# Patient Record
Sex: Female | Born: 1955 | Race: White | Hispanic: No | Marital: Married
Health system: Midwestern US, Academic
[De-identification: ages and names within clinical notes are randomized; demographics above are authoritative.]

---

## 2016-06-12 IMAGING — CR CHEST
2 series · 2 of 2 positions shown · non-contrast
Comparison: none

[chest pa]
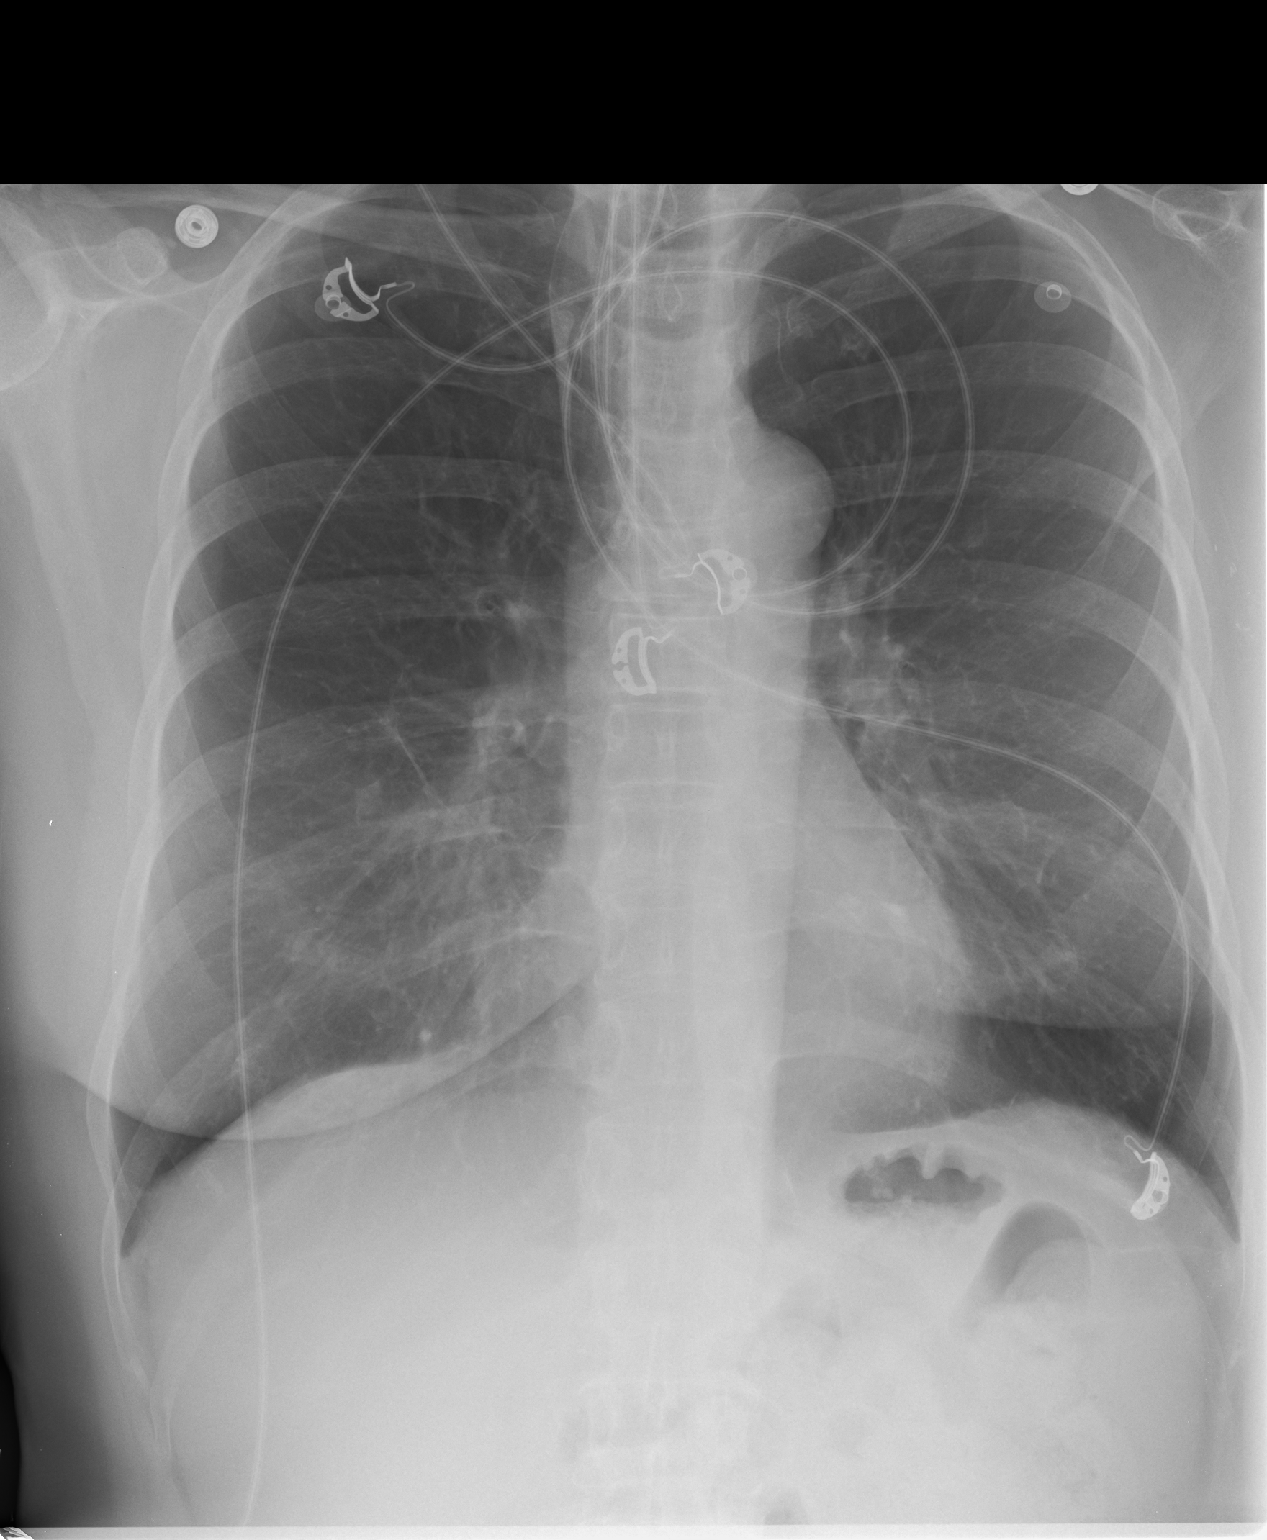

[chest lat]
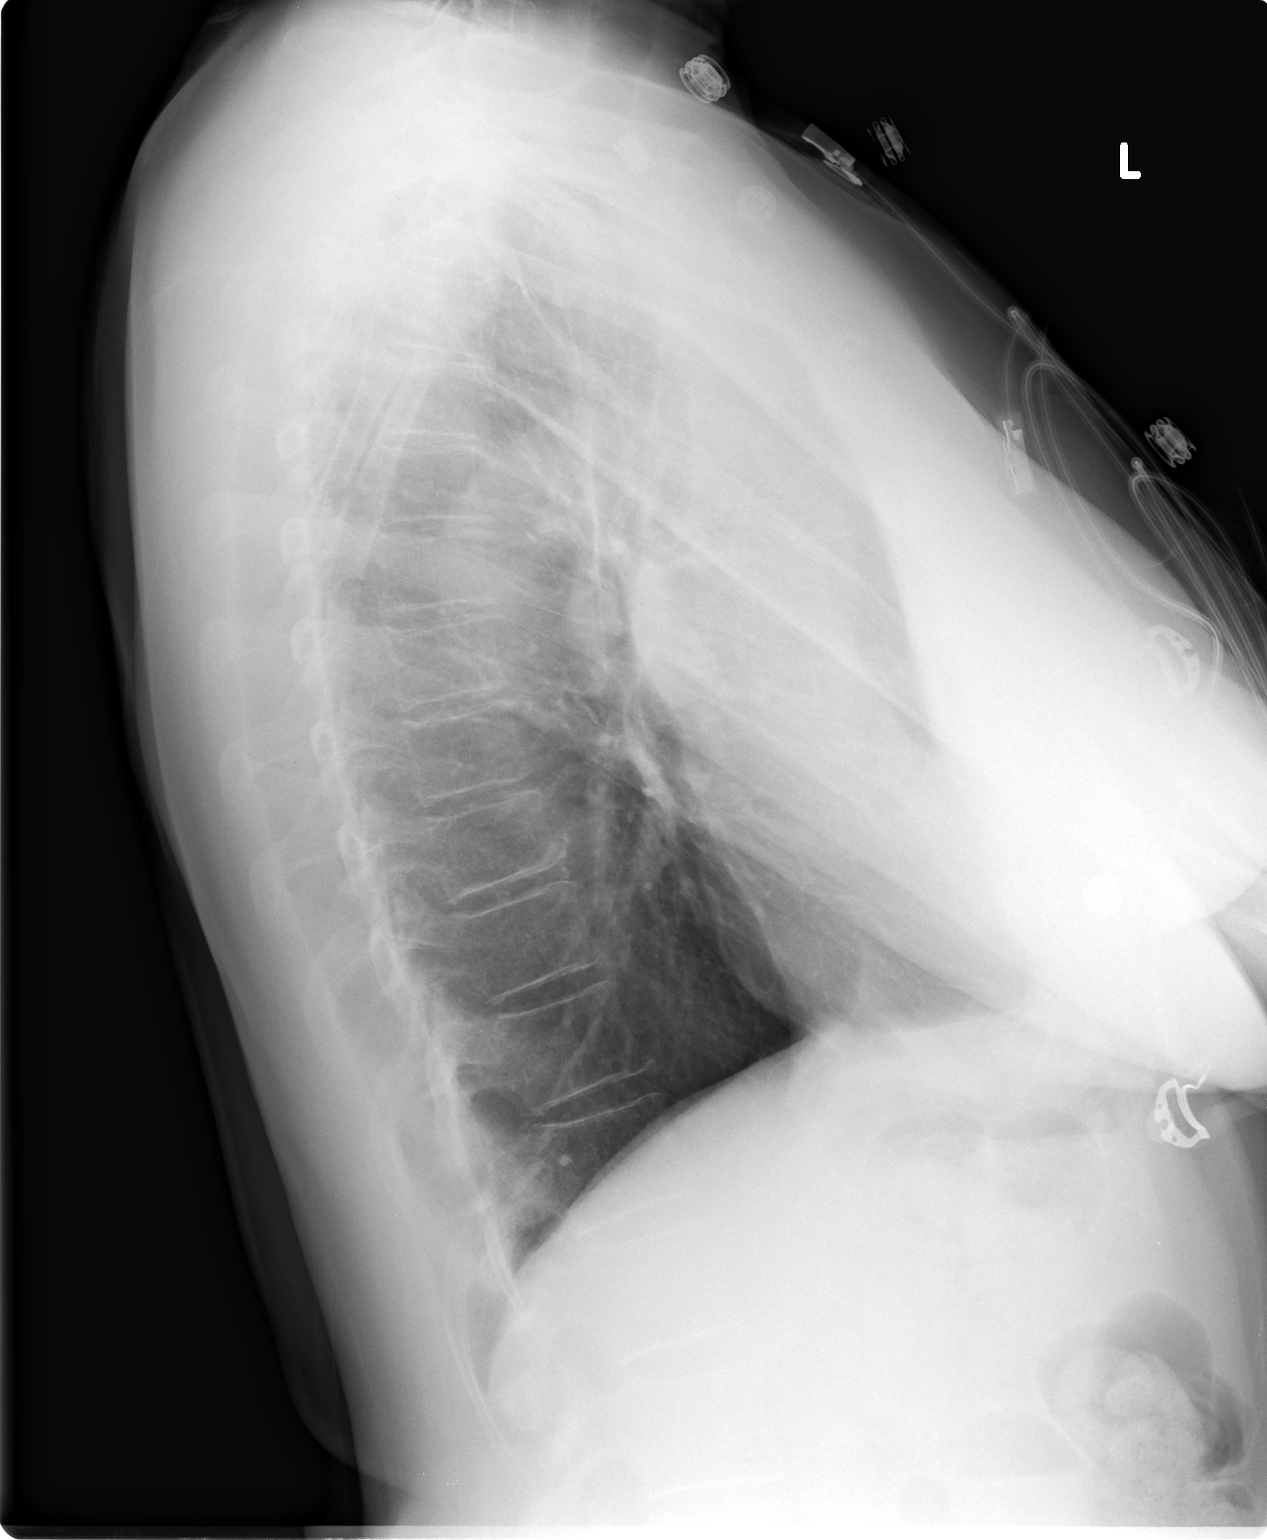

[2 of 2 positions shown; findings below may reference images not displayed]

EXAM

RADIOLOGICAL EXAMINATION, CHEST; 2 VIEWS FRONTAL AND LATERAL CPT 65808

INDICATION

CHEST PAIN/TIGHTNESS
CHEST PAIN/TIGHTNESS. SIEZURE LIKE ACTIVITY THIS AM.

TECHNIQUE

2 views of the chest were acquired.

COMPARISONS

None

FINDINGS

The cardiac silhouette is within normal limits. The lungs are clear. The pulmonary vasculature is
normal in caliber.

IMPRESSION

No acute cardiopulmonary process. Previous granulomatous disease is noted with a calcified
granuloma in the right lung base.

## 2016-06-12 IMAGING — CT Head^_WITHOUT_CONTRAST (Adult)
2 series · 15 of 30 positions shown, 19 images · non-contrast
Comparison: none

[Series 2: brain w/o 4.8 brain · axial · non-contrast · 0.55mm/px · z∈[+112,+234]mm · 13 of 28 slices shown, 17 images]
[im 2/28  brain]
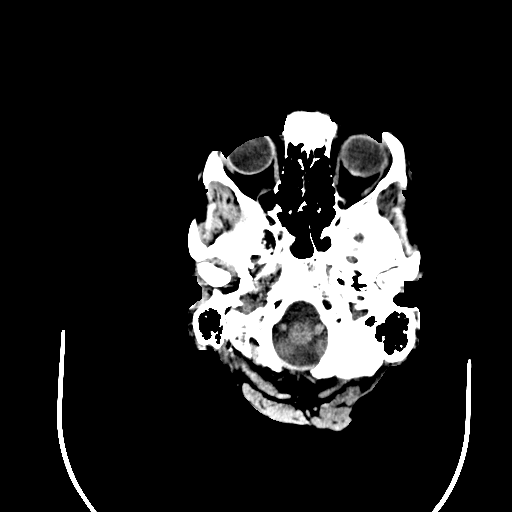
[im 2/28  bone]
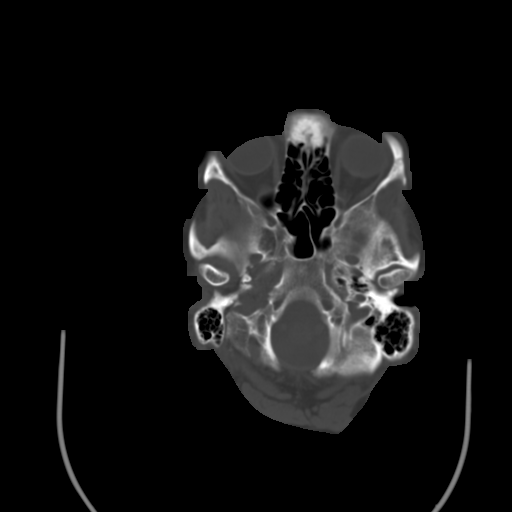
[im 4/28  brain]
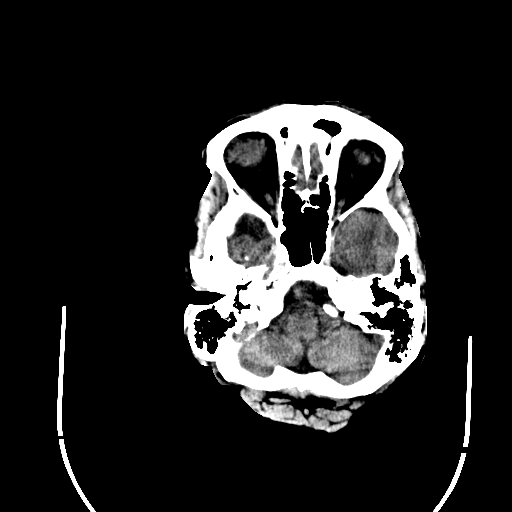
[im 6/28  brain]
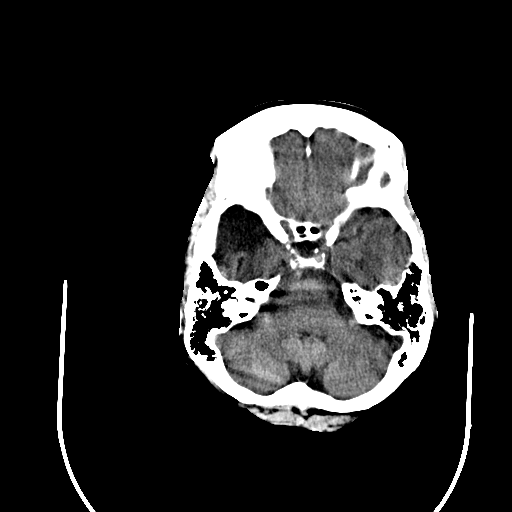
[im 8/28  brain]
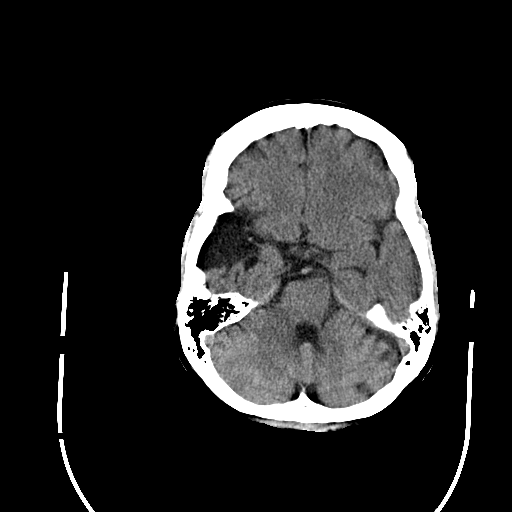
[im 10/28  brain]
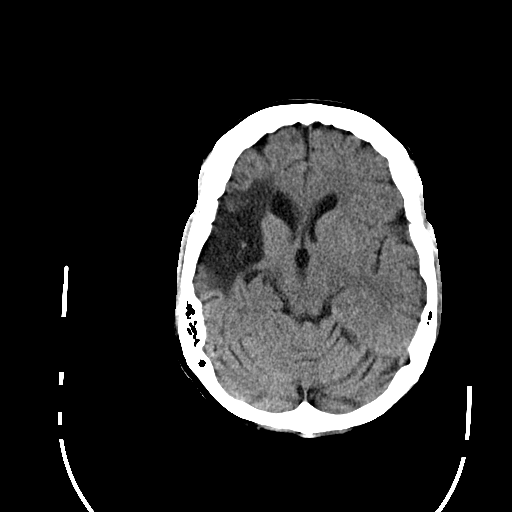
[im 10/28  bone]
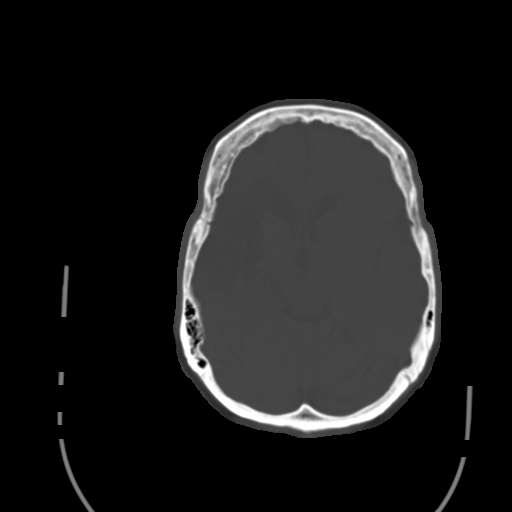
[im 12/28  brain]
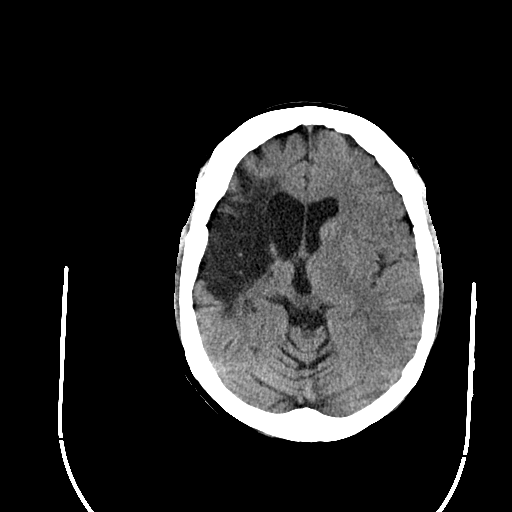
[im 14/28  brain]
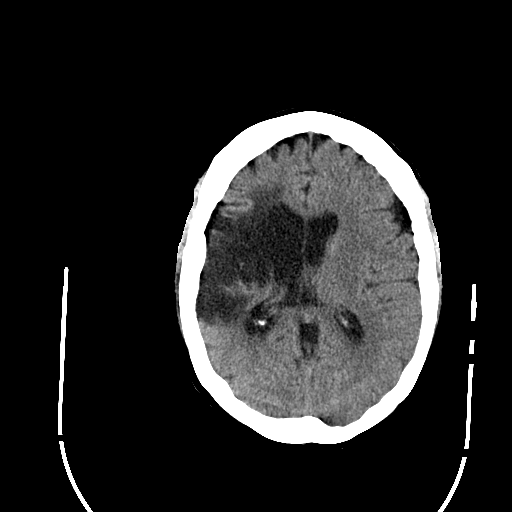
[im 16/28  brain]
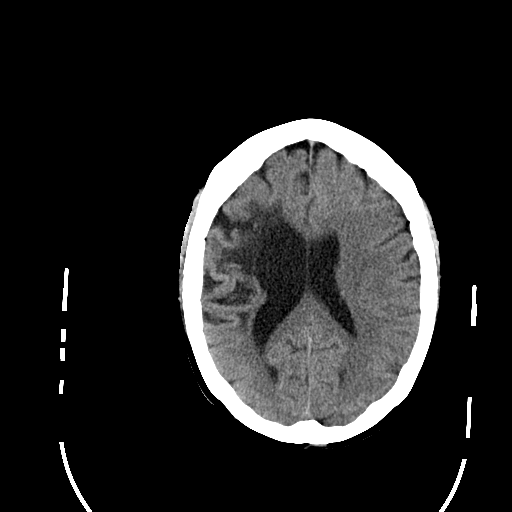
[im 18/28  brain]
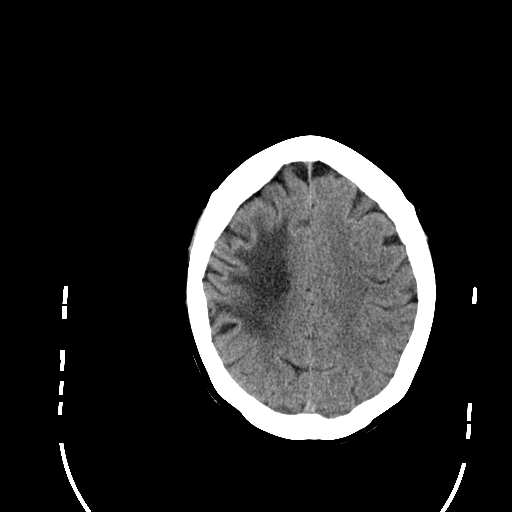
[im 18/28  bone]
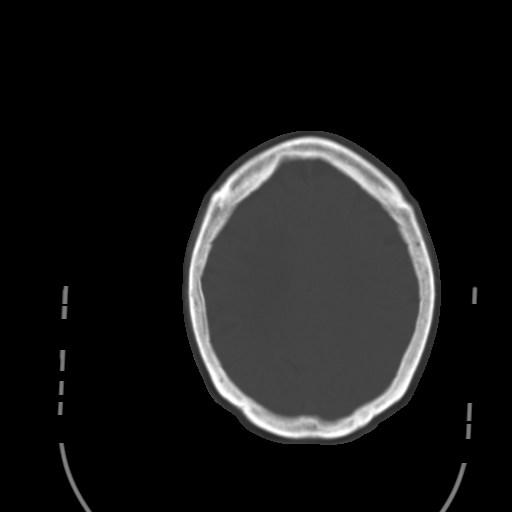
[im 20/28  brain]
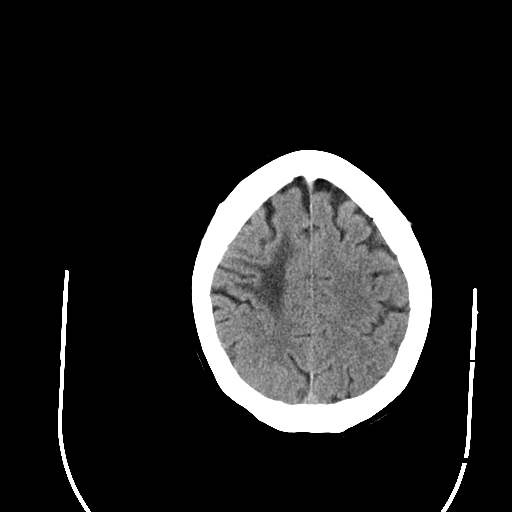
[im 22/28  brain]
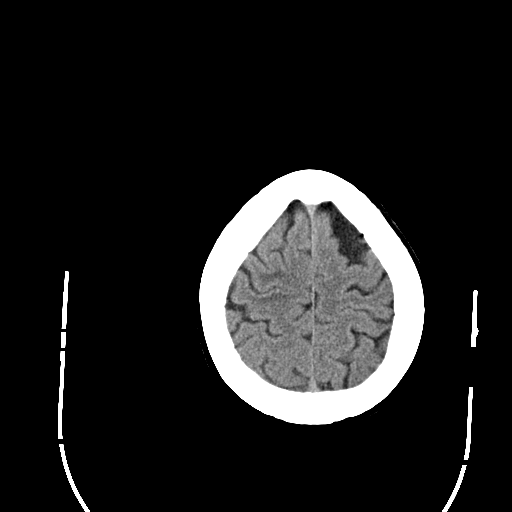
[im 24/28  brain]
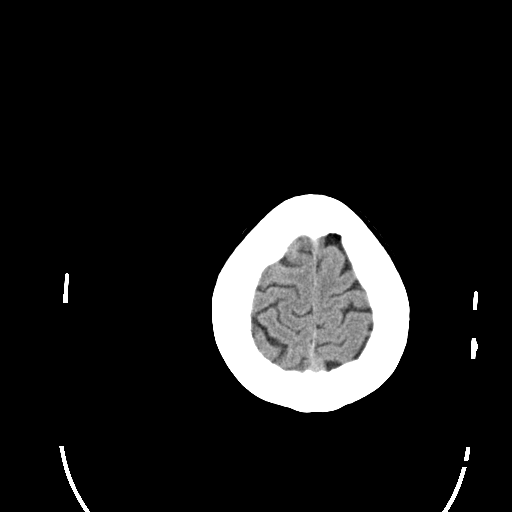
[im 26/28  brain]
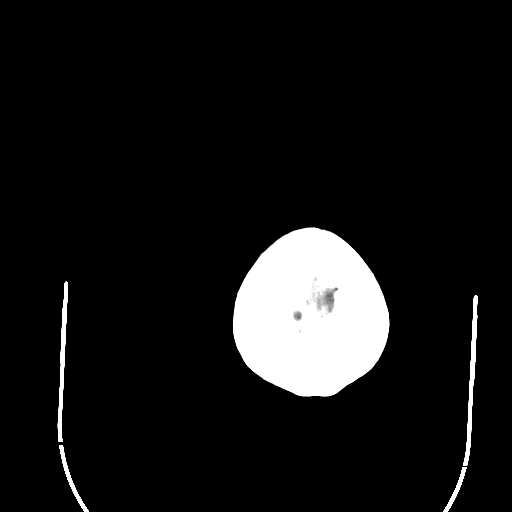
[im 26/28  bone]
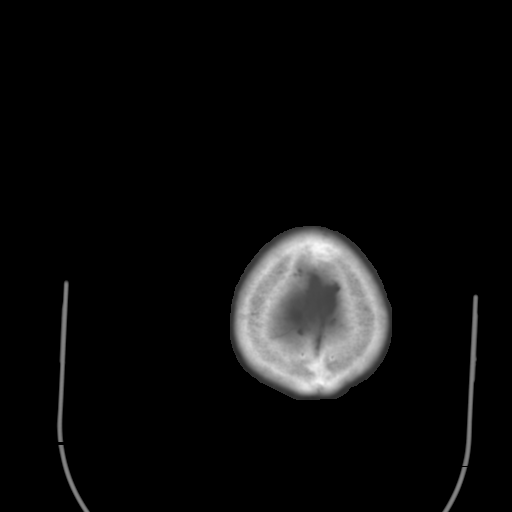

[Series 3: brain w/o 4.8 bone · axial · non-contrast · 0.55mm/px · z∈[+112,+131]mm · 2 of 29 slices shown]
[im 3/29  bone]
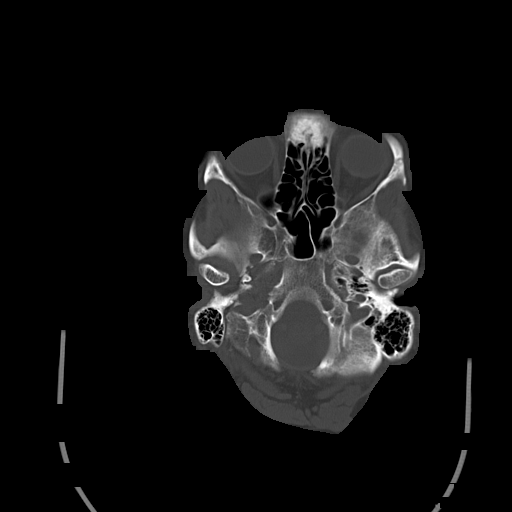
[im 7/29  bone]
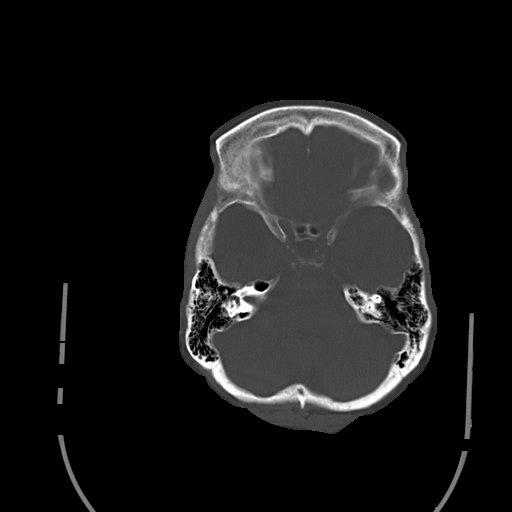

[15 of 30 positions shown; findings below may reference images not displayed]

EXAM

Head CT without contrast.

INDICATION

Possible seizure activity today
SEIZURE LIKE ACTIVITY TODAY. H/O RIGHT SIDED STROKE 24 YEARS AGO. NO PRIOR
EXAXMS. BG/ME

FINDINGS

CT of the head was performed without contrast.

All CT scans at this facility use dose modulation, iterative reconstruction, and/or weight based
dosing when appropriate to reduce radiation dose to as low as reasonably achievable.

There is evidence of a large remote infarct involving the cortex and white matter of the right
frontal lobe antral lateral aspect of the right temporal lobe. This also extends into the anterior
portion of the right parietal lobe.

There is compensatory hypertrophy of there right lateral ventricle.

There is no acute hemorrhage. There is no mass effect or midline shift.

IMPRESSION

There is a large chronic infarct of the cortex and white matter of the right frontal lobe and
anterior portions of the right temporal and parietal lobes. No acute appearing brain lesion is
identified. There is no hemorrhage there. There is no mass effect or midline shift.

## 2017-02-11 IMAGING — MG MAMMOGRAM, DIGITAL SCREEN BILA
1 series · 3 of 3 positions shown · non-contrast
Comparison: none

[Series 2: R CC · right · 3 of 3 slices shown]
[im 1/3]
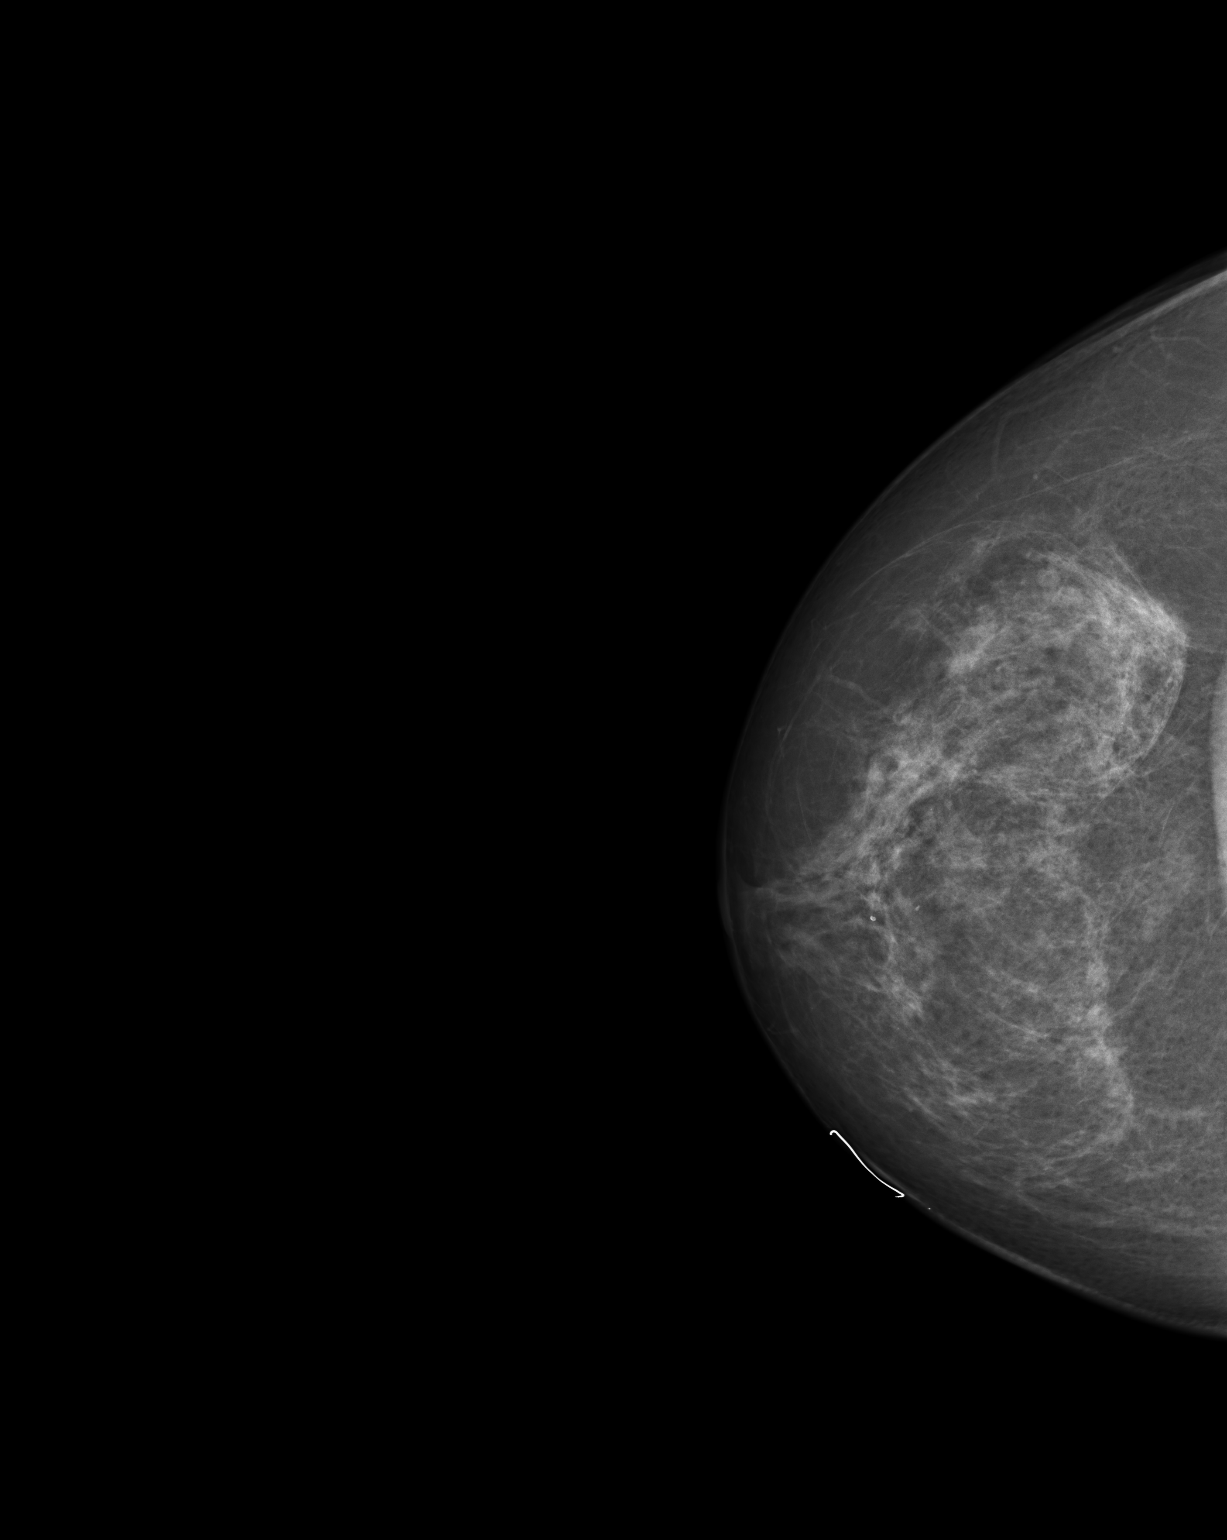
[im 2/3]
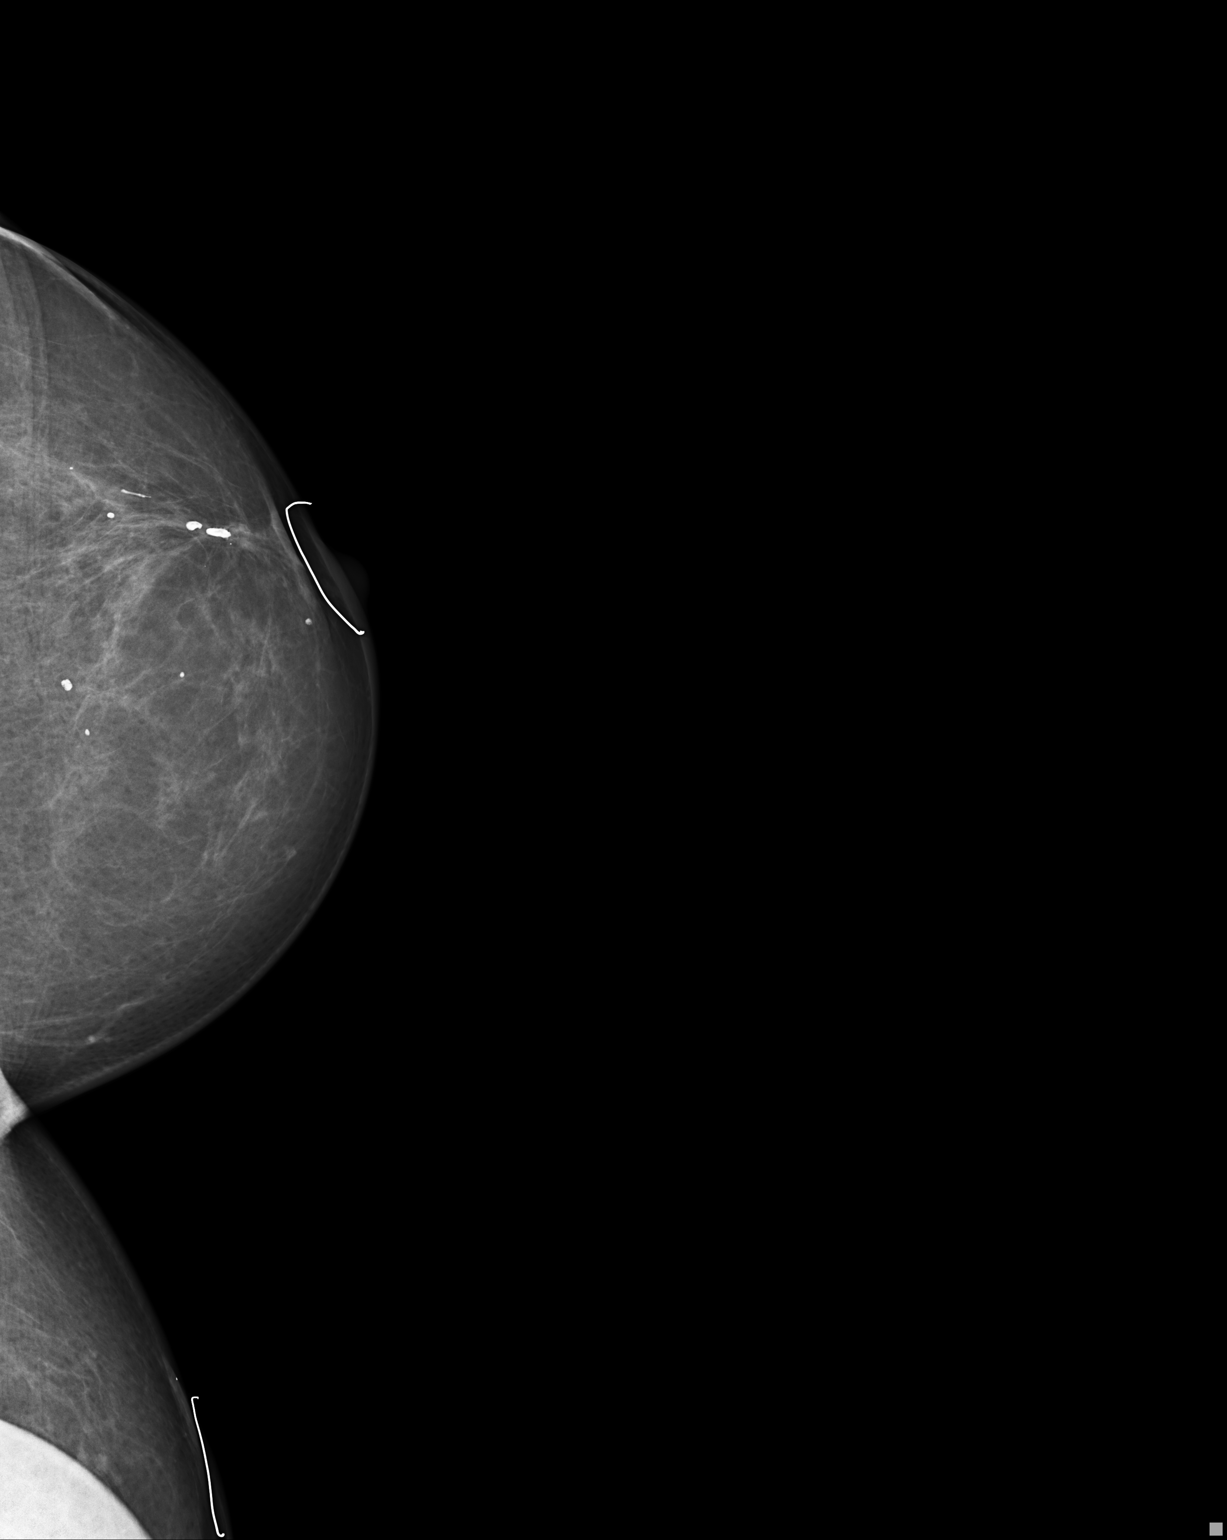
[im 3/3]
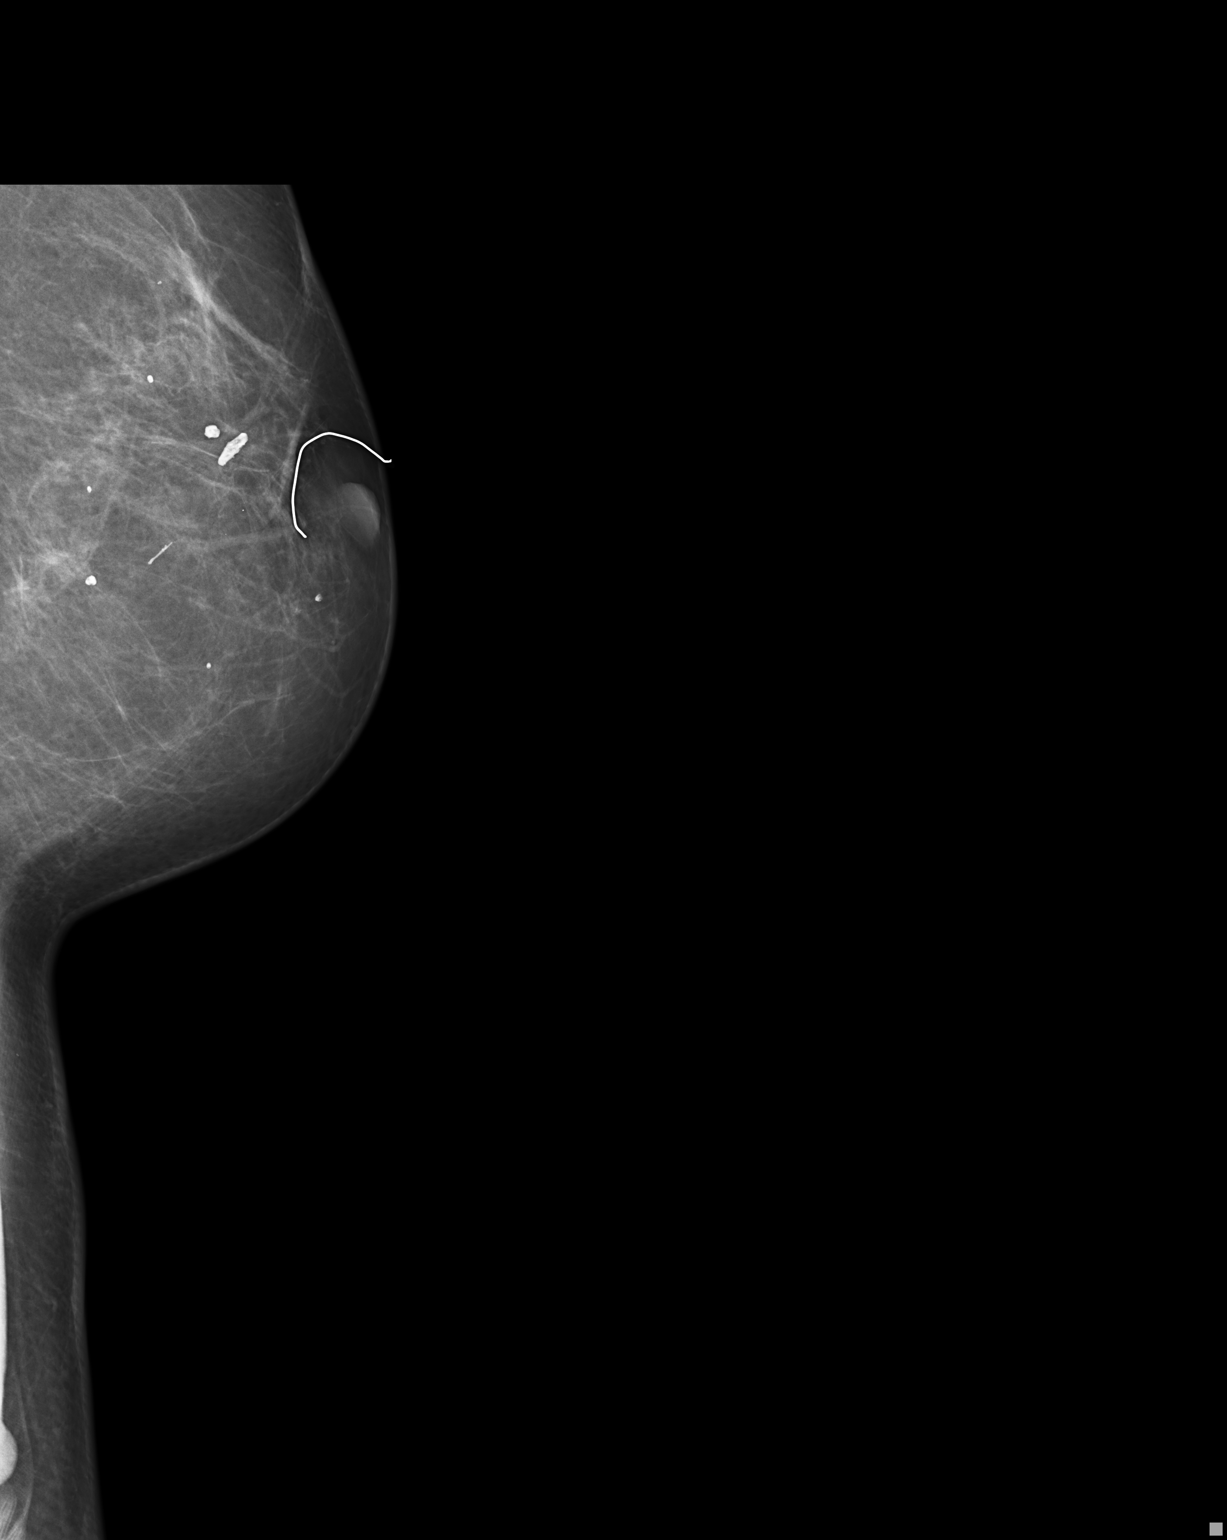

[3 of 3 positions shown; findings below may reference images not displayed]

DIAGNOSTIC STUDIES

EXAM

Screening mammogram.

INDICATION

SCREENING
BREAST CA, LT BREAST 6336, LUMPECTOMY, RADIATION. BOTH SISTERS DX @ 40 AND
41. PT'S STROKE AND MUSCLE TIGHTNESS SEVERELY LIMITS OPTIMAL POSITIONING
OF LMLO. SCREENING. AB (S2D) PRIORS: [id number] AT [facility].

TECHNIQUE

Digitally acquired CC and MLO views of both breasts. CAD utilized for interpretation.

COMPARISONS

21 October, 2014.

FINDINGS

Scattered fibroglandular tissue. Scar markers are present bilaterally. There is some architectural
distortion in the left breast. Benign microcalcifications. There is no compelling evidence of new
mass, distortion, skin thickening, nipple retraction, or suspicious calcification. Note that there
is poor visualization of the left posterior breast, most likely due to positional difficulty.
However, the included breast tissue is similar to prior exam.

IMPRESSION

Benign, BI-RADS category 2.

RECOMMENDATION

Annual screening mammography. A notification letter will be sent to the patient regarding findings
and recommendations.

## 2017-03-11 IMAGING — CT Head^_WITHOUT_CONTRAST (Adult)
1 series · 15 of 30 positions shown, 19 images · non-contrast
Comparison: none

[Series 2: brain w/o 4.8 brain · axial · non-contrast · 0.55mm/px · z∈[+129,+278]mm · 15 of 33 slices shown, 19 images]
[im 2/33  brain]
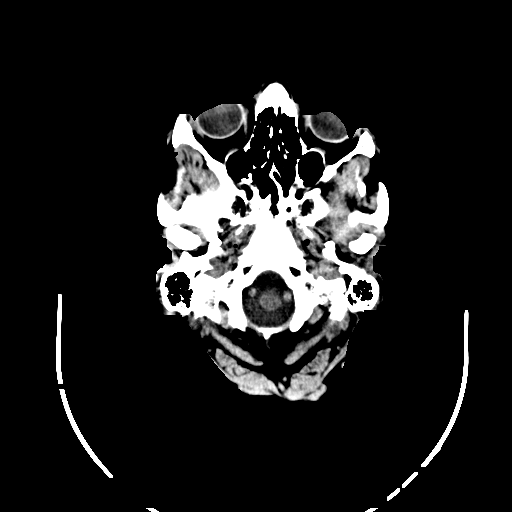
[im 2/33  bone]
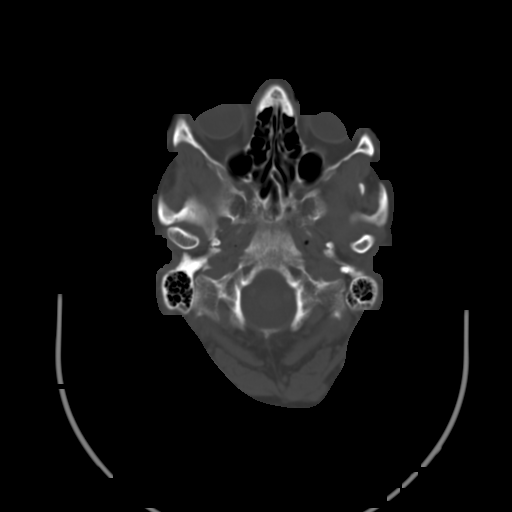
[im 4/33  brain]
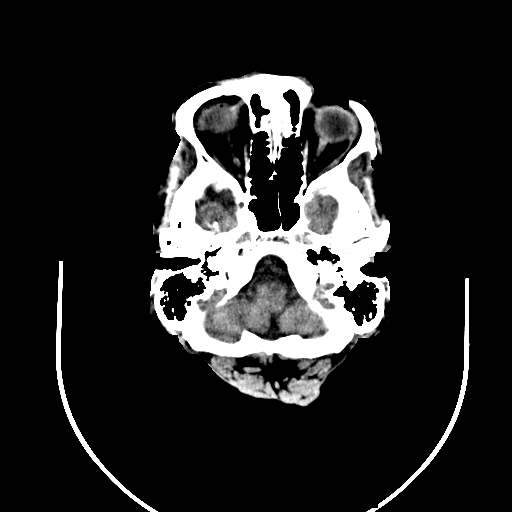
[im 6/33  brain]
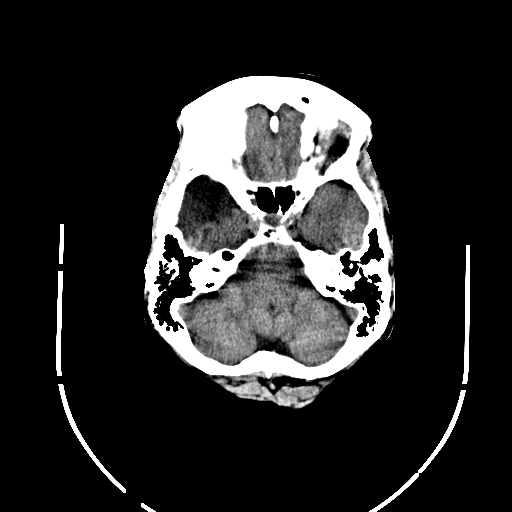
[im 8/33  brain]
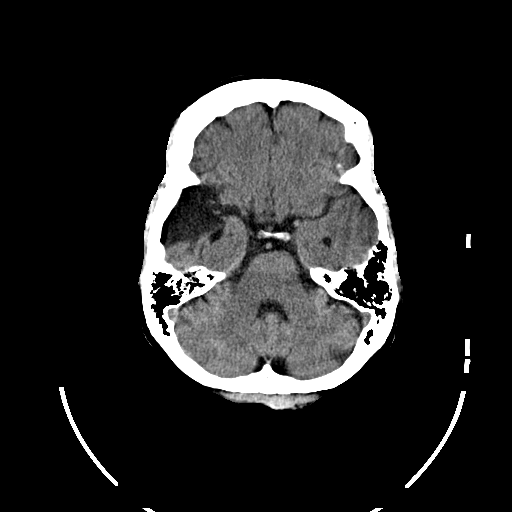
[im 10/33  brain]
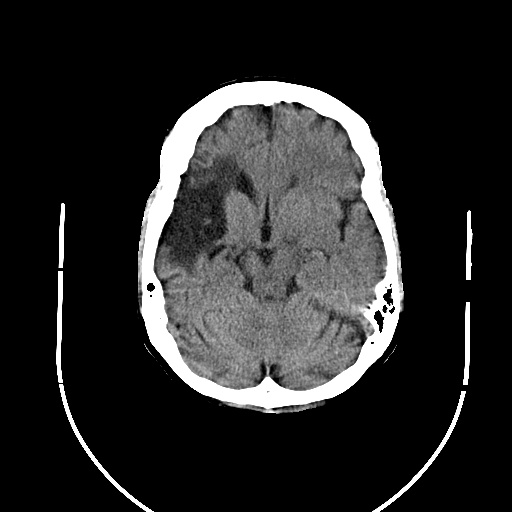
[im 10/33  bone]
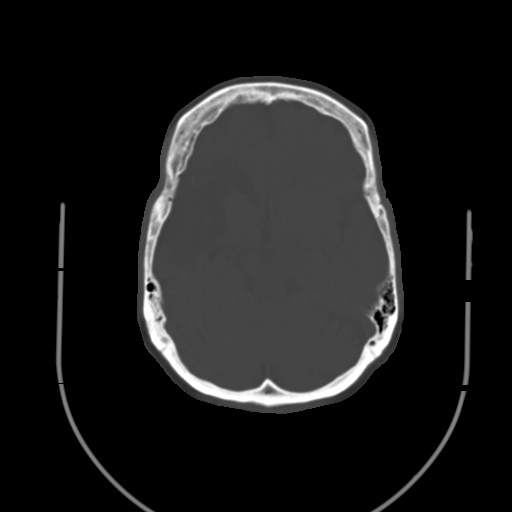
[im 13/33  brain]
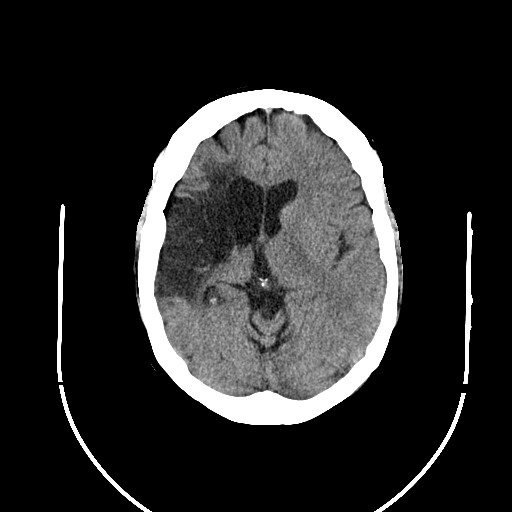
[im 15/33  brain]
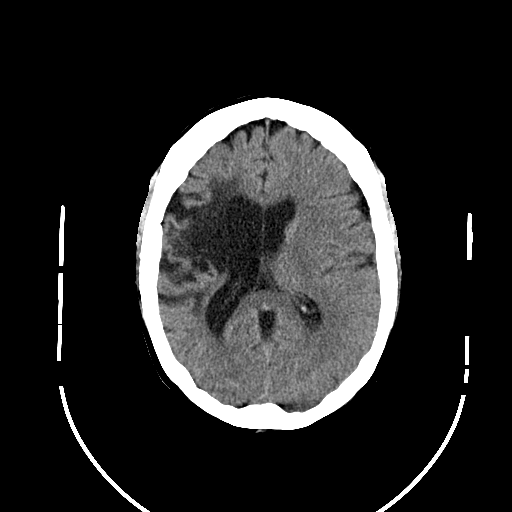
[im 17/33  brain]
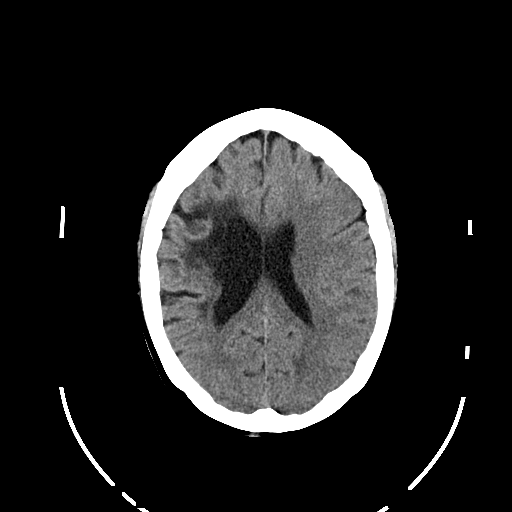
[im 18/33  brain]
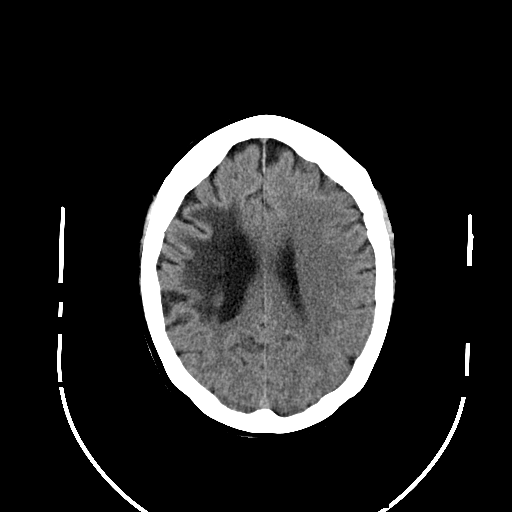
[im 18/33  bone]
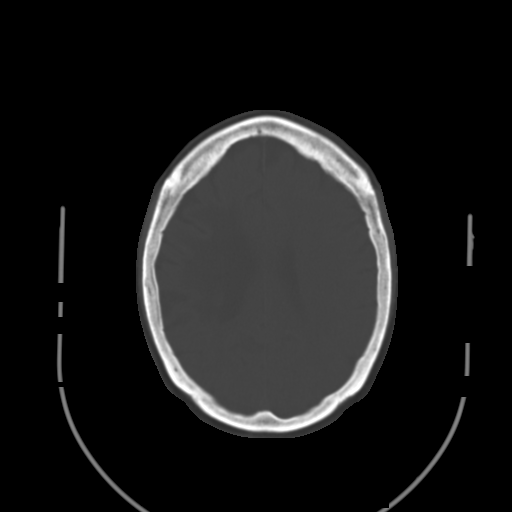
[im 20/33  brain]
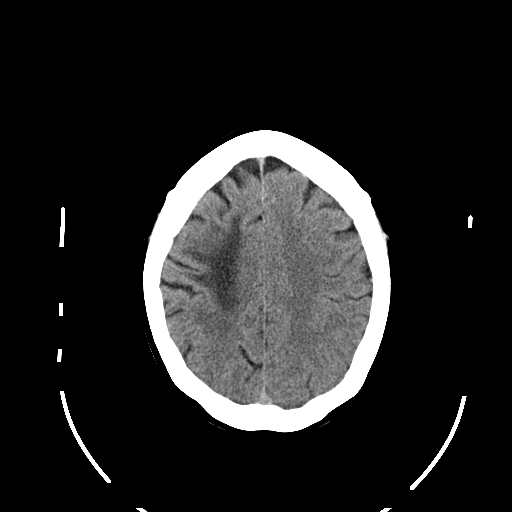
[im 23/33  brain]
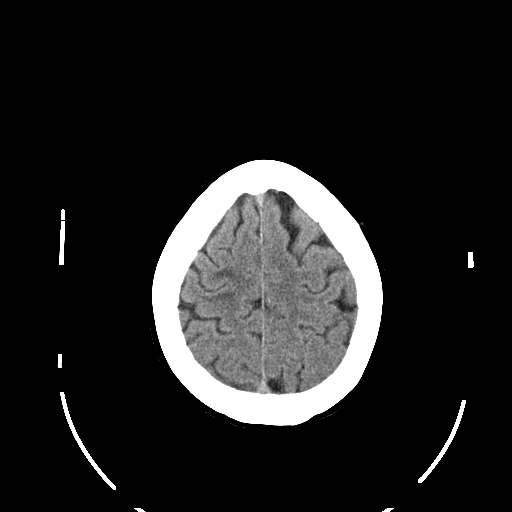
[im 25/33  brain]
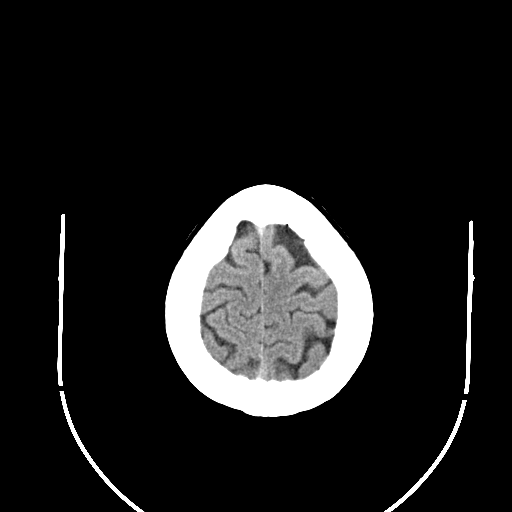
[im 27/33  brain]
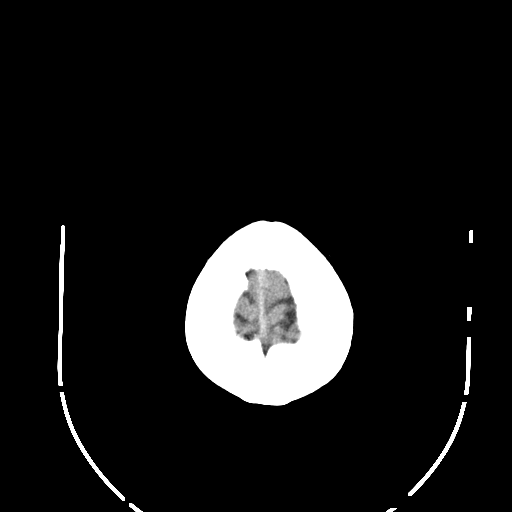
[im 27/33  bone]
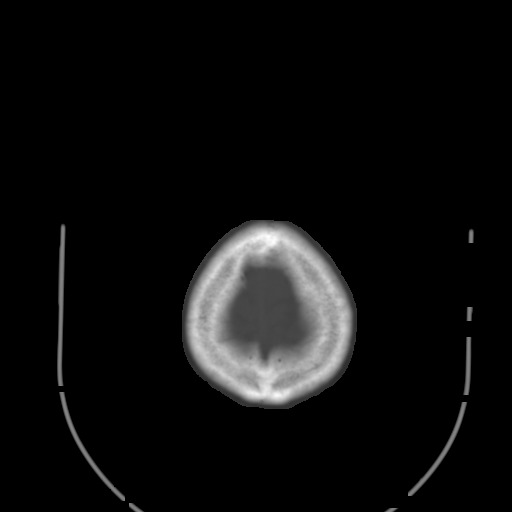
[im 29/33  brain]
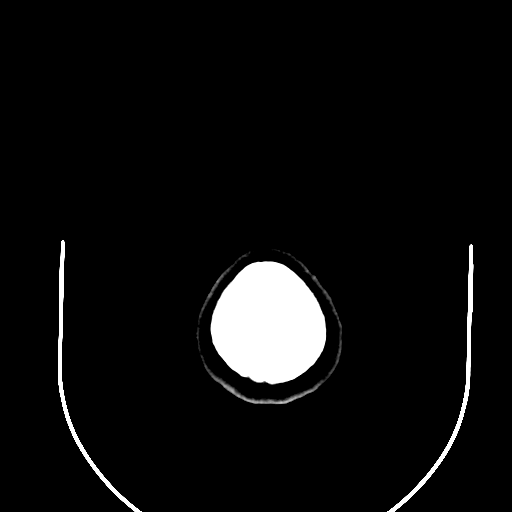
[im 31/33  brain]
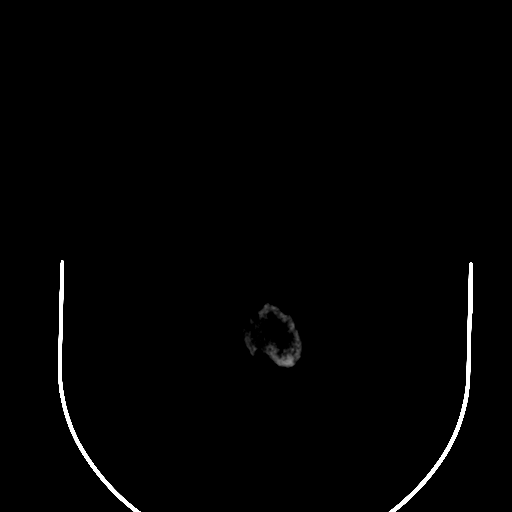

[15 of 30 positions shown; findings below may reference images not displayed]

EXAM

COMPUTED TOMOGRAPHY, HEAD OR BRAIN; WITHOUT CONTRAST MATERIAL; CPT 44394

INDICATION

tia sx's
MEMORY LAPSE, REPEATING HERSELF, CONFUSION. HX: TIA SX. LT SIDE PARALYSIS.

TECHNIQUE

Noncontrast head CT was performed. All CT scans at this facility use dose modulation, iterative
reconstruction, and/or weight based dosing when appropriate to reduce radiation dose to as low as
reasonably achievable.

COMPARISONS

Head CT 12 June, 2016.

FINDINGS

There is extensive cystic encephalomalacia in the right anterior and mid MCA territory compatible
with old infarction, not significantly changed from prior study. Ex vacuo dilatation of the right
lateral ventricle. No evidence for acute infarct, mass, hemorrhage, or midline shift. No extra-
axial fluid collections. Mild to moderate chronic small vessel ischemic and involutional changes
for age. Gray/white matter differentiation is otherwise preserved. Lateral ventricles are symmetric
and proportional with other CSF spaces.

Review of bone and soft tissue windows is unremarkable.

IMPRESSION

1. No acute intracranial abnormality.

2. Stable head CT with chronic changes of remote right MCA territory infarction.

## 2017-09-14 IMAGING — CR LOW_EXM
3 series · 3 of 3 positions shown · non-contrast
Comparison: none

[knee ap]
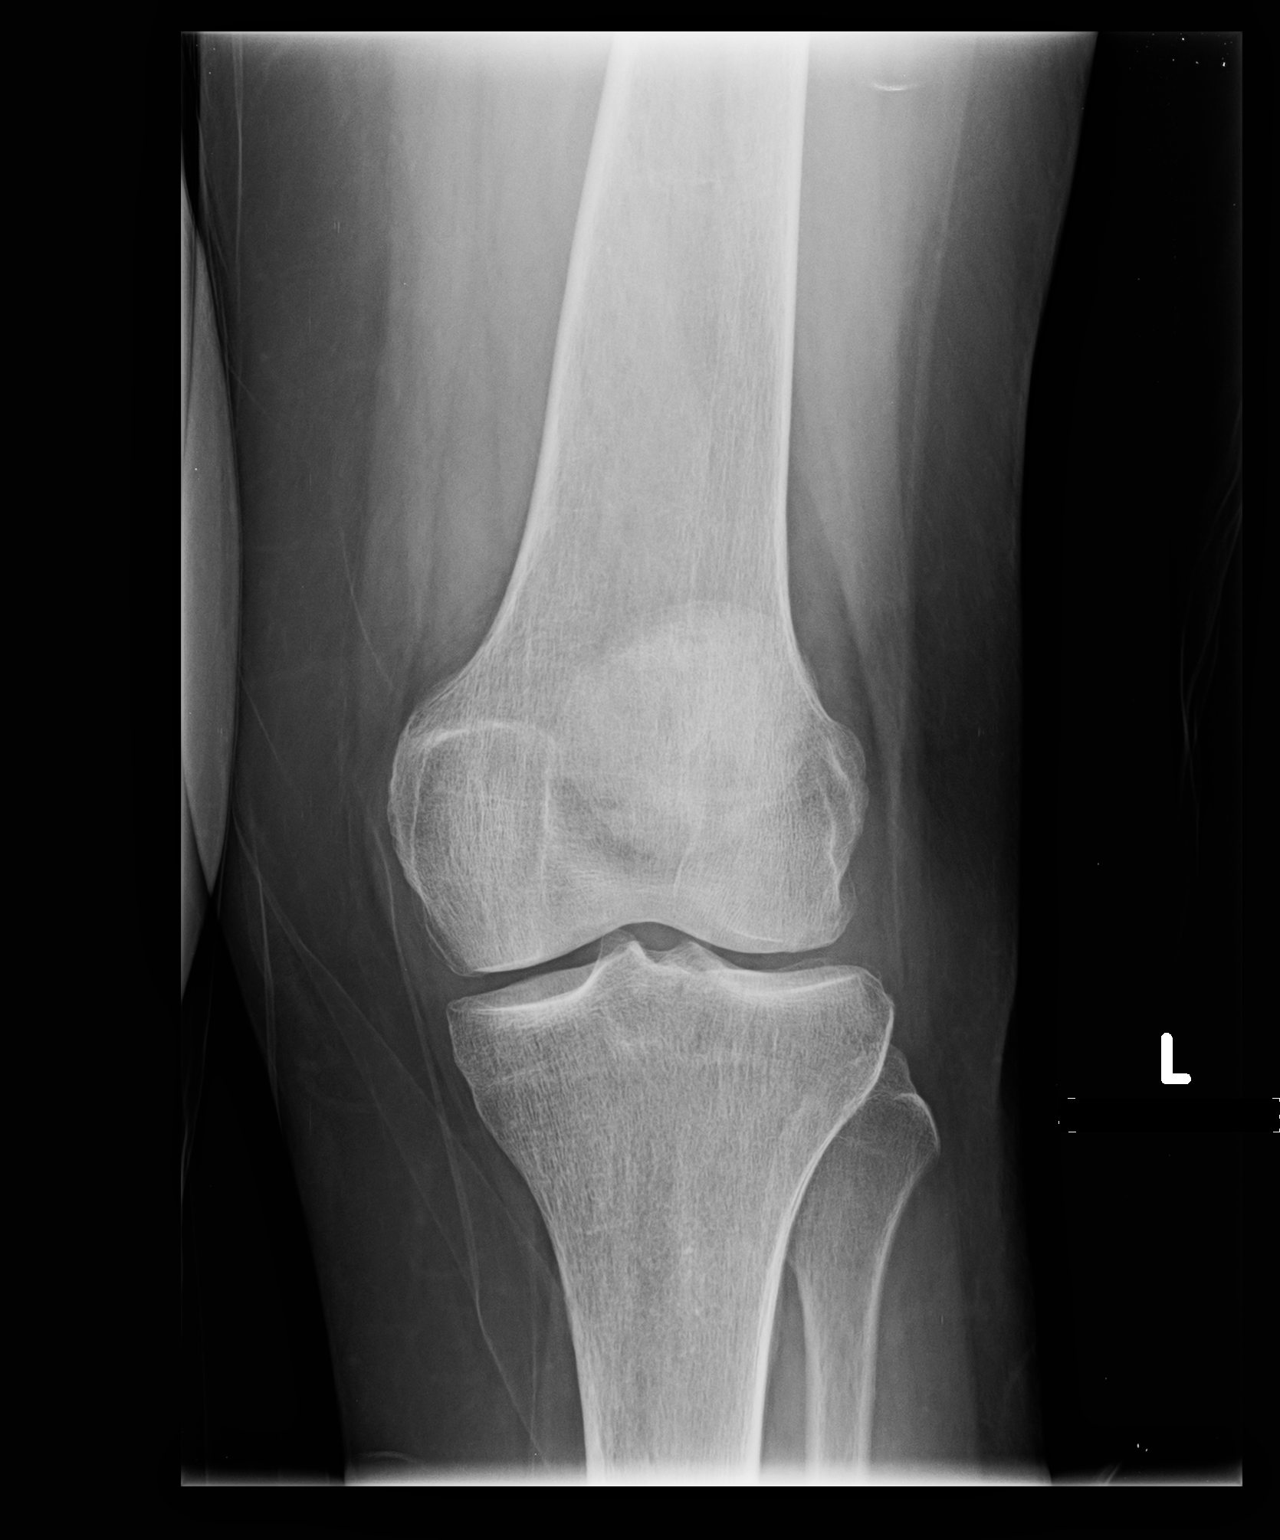

[knee sunrise]
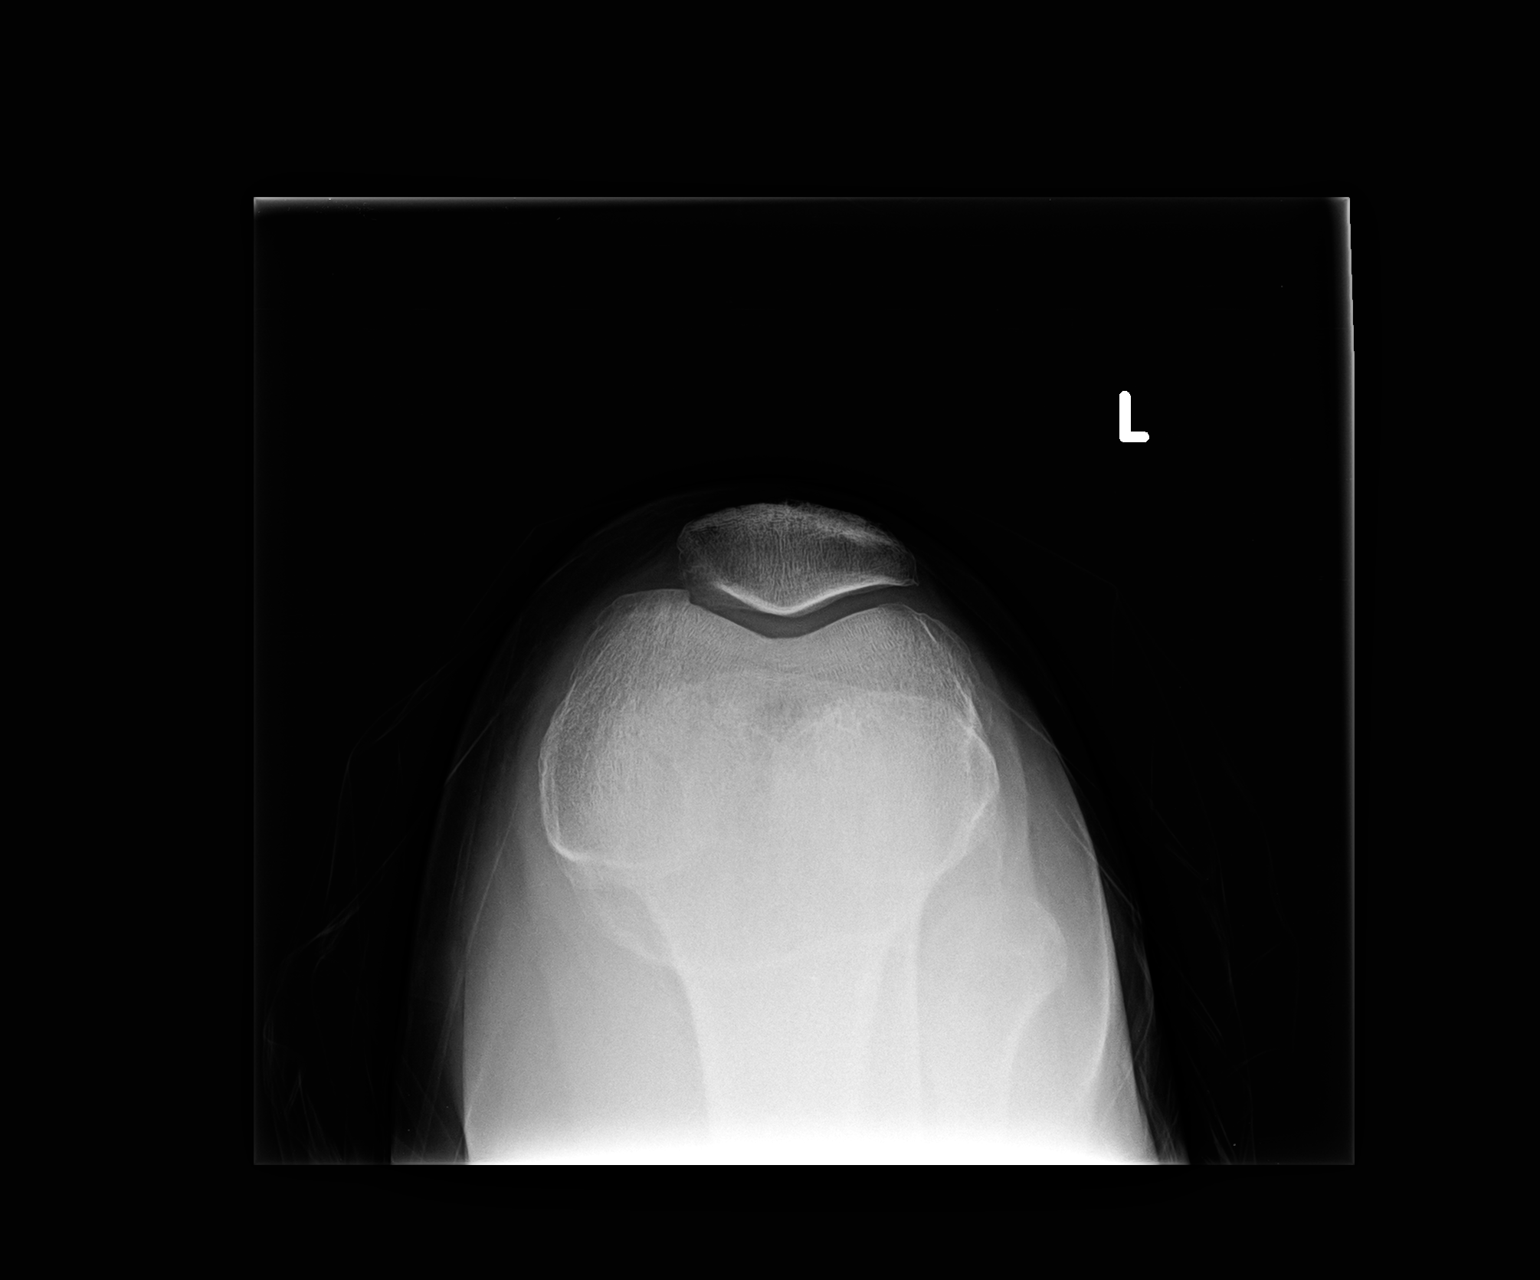

[knee lat]
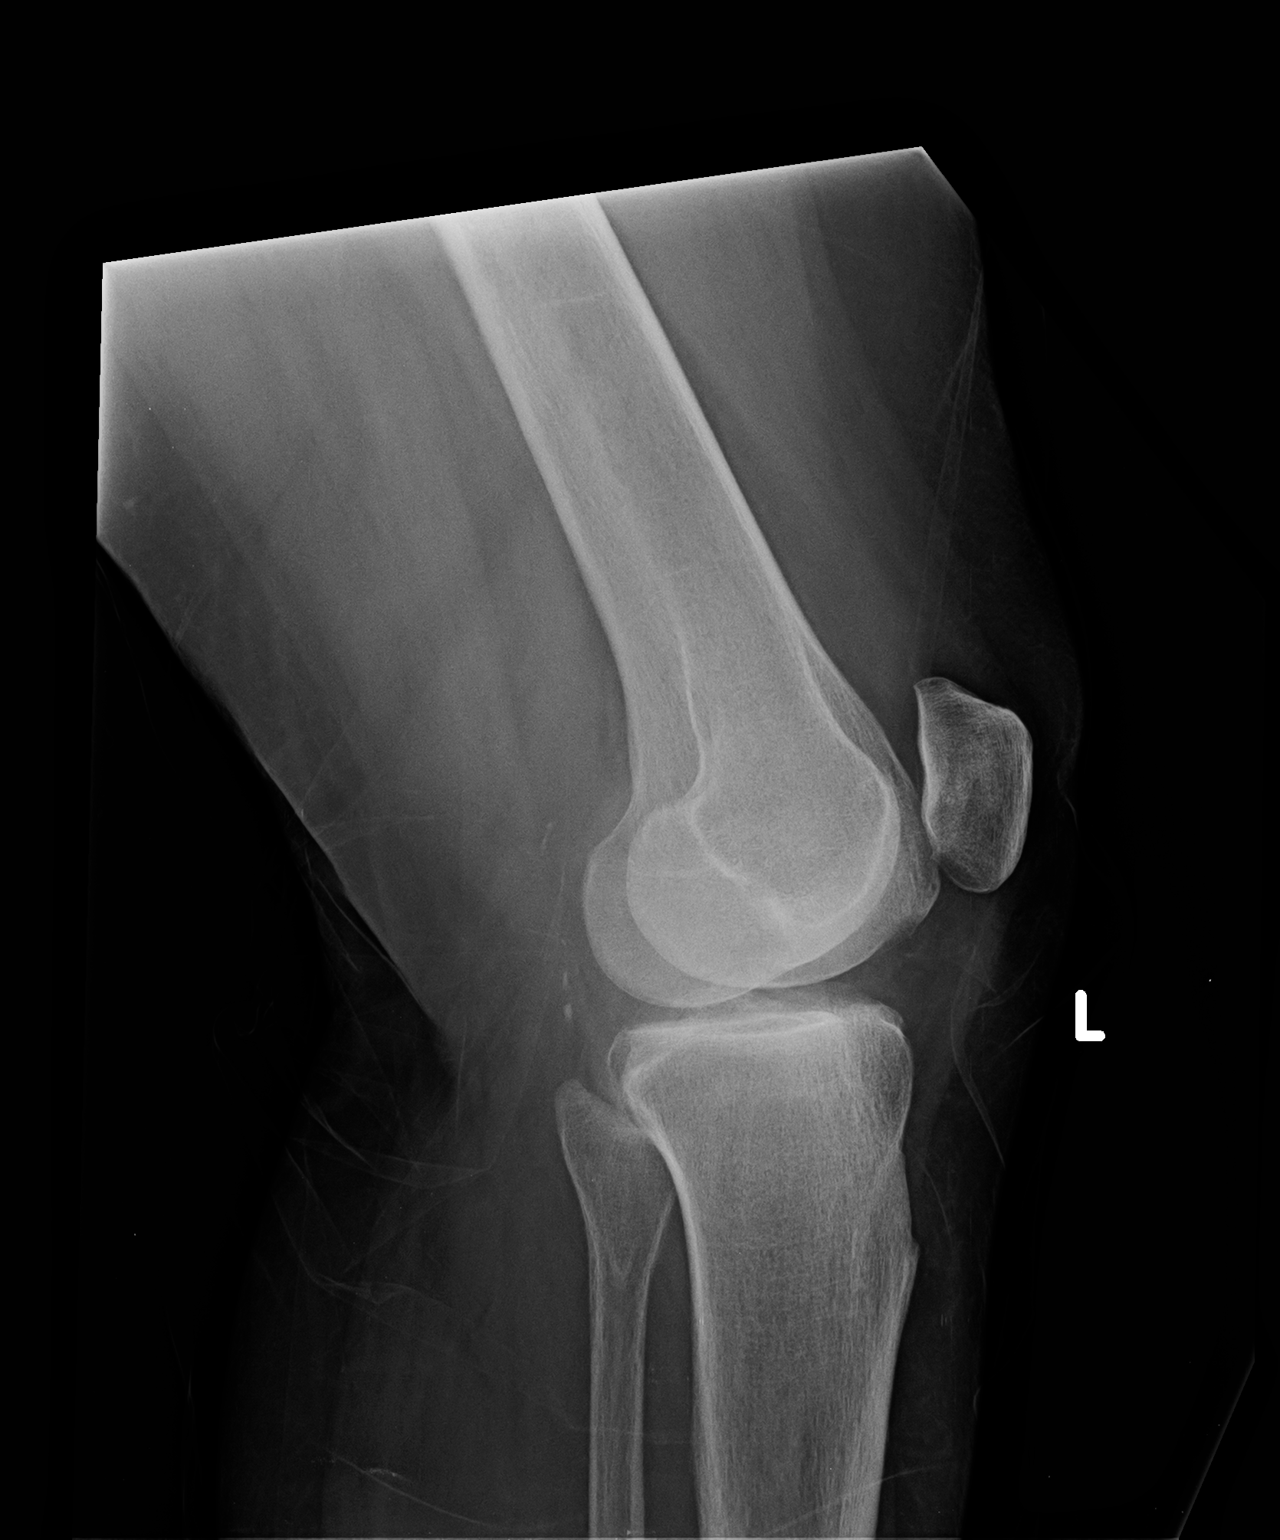

[3 of 3 positions shown; findings below may reference images not displayed]

EXAM
Left knee.

INDICATION
Chronic pain, worsening over 6 months. Clicking noise.

FINDINGS
Three views of the left knee without comparison demonstrate maintained bone density. There is no
destruction, fracture, or dislocation.
The joint spaces generally maintained. There are small marginal and patellar osteophytes.

IMPRESSION
No acute bony abnormality is identified. Very early degenerative changes are noted.

Tech Notes:

PT. C/O LT. KNEE PAIN X6 MONTHS.

## 2018-02-22 IMAGING — MG MAMMOGRAM, DIGITAL SCREEN BILA
1 series · 5 of 5 positions shown · non-contrast
Comparison: none

[Series 2: R CC · right · 5 of 5 slices shown]
[im 1/5]
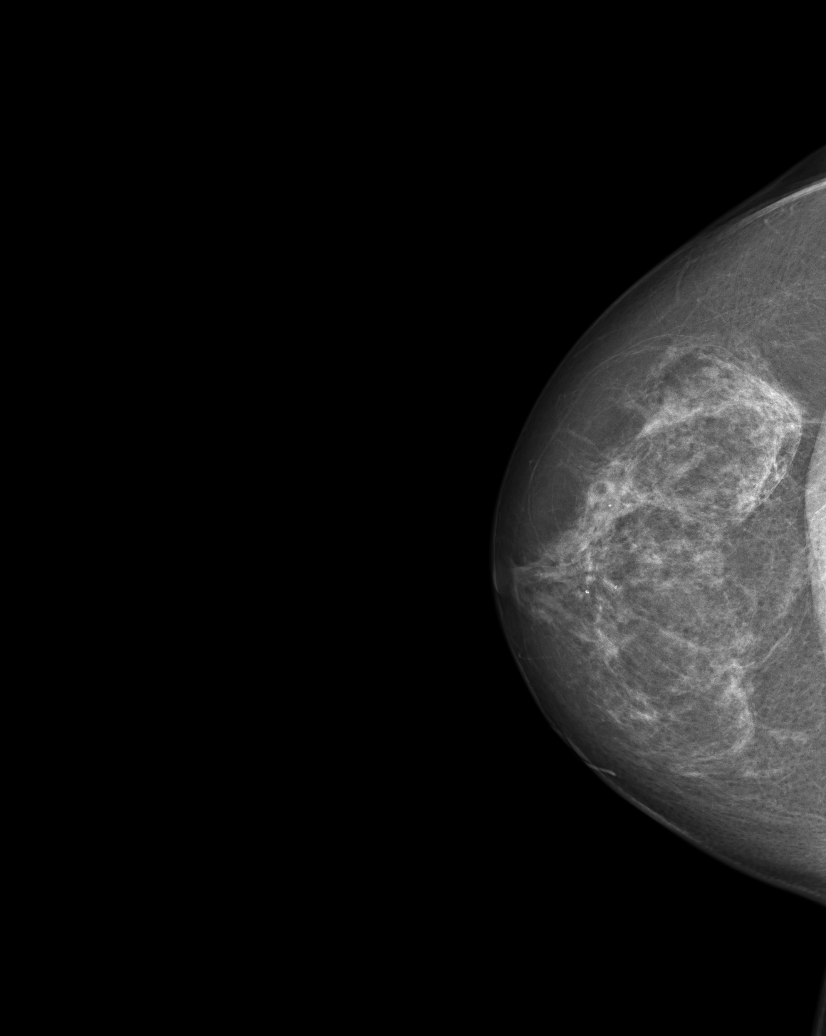
[im 2/5]
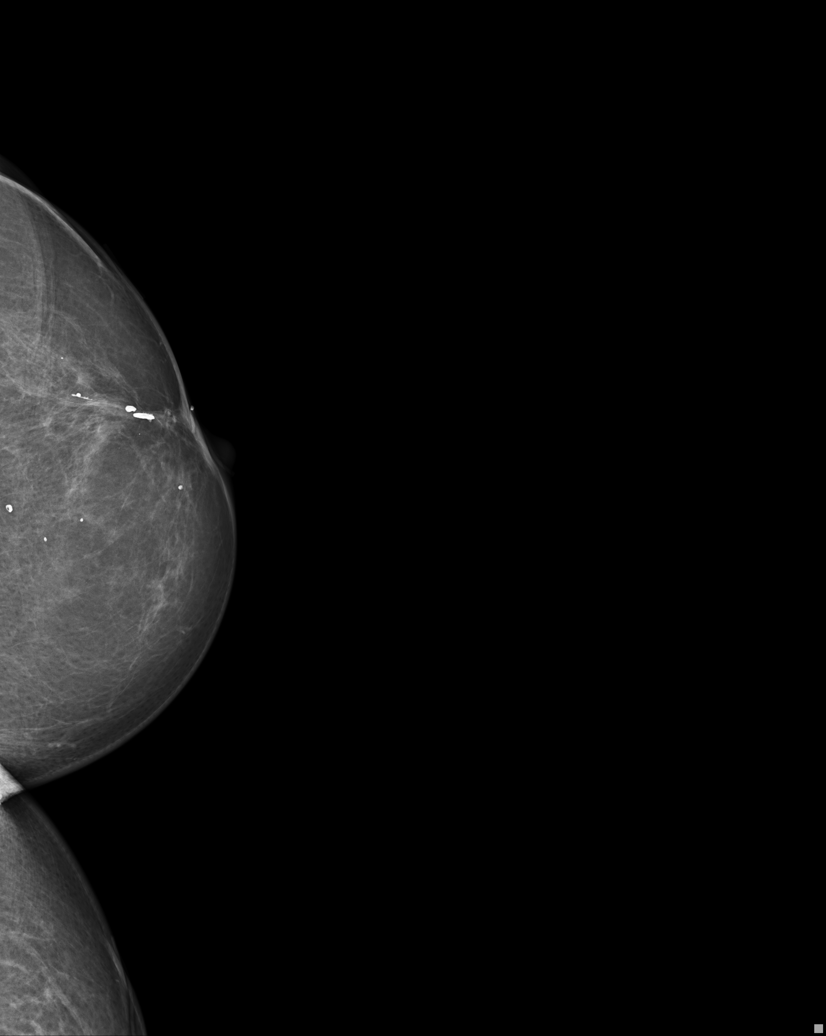
[im 3/5]
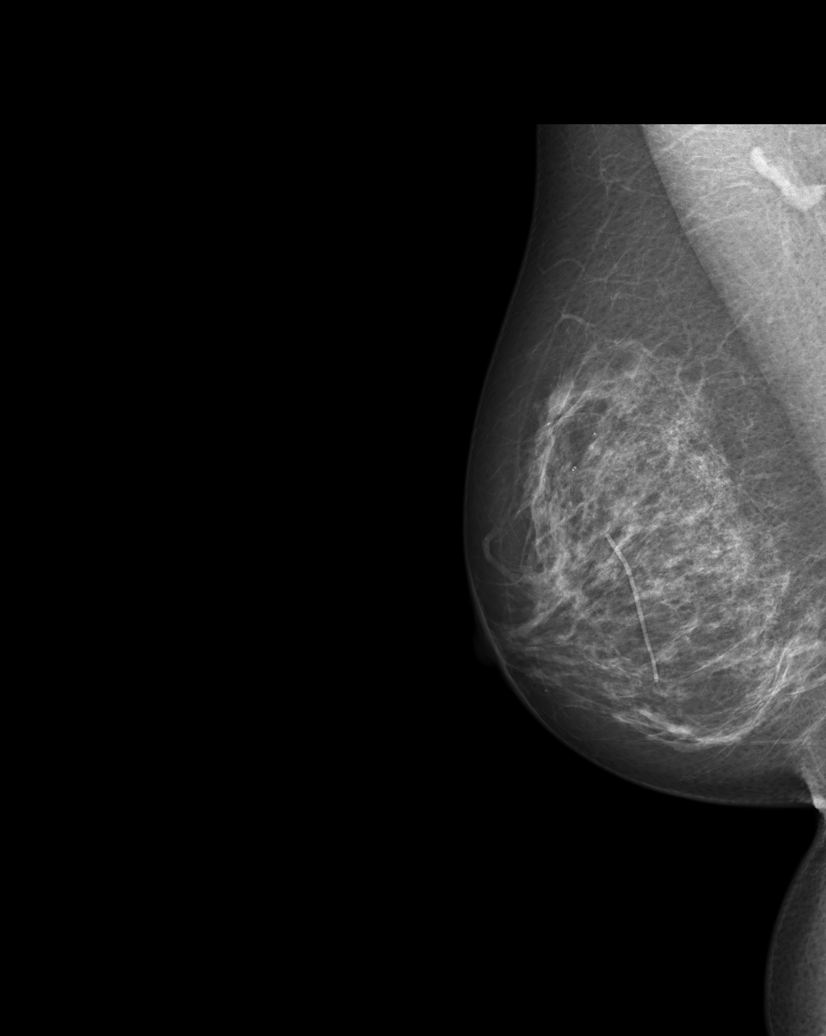
[im 4/5]
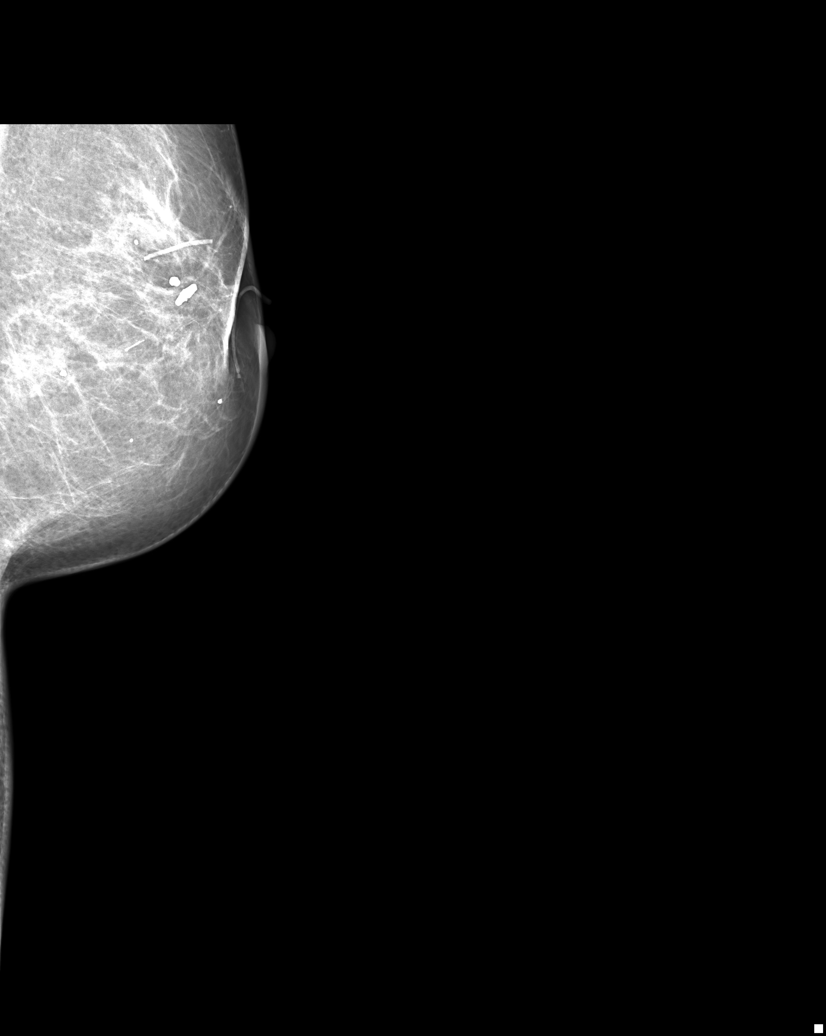
[im 5/5]
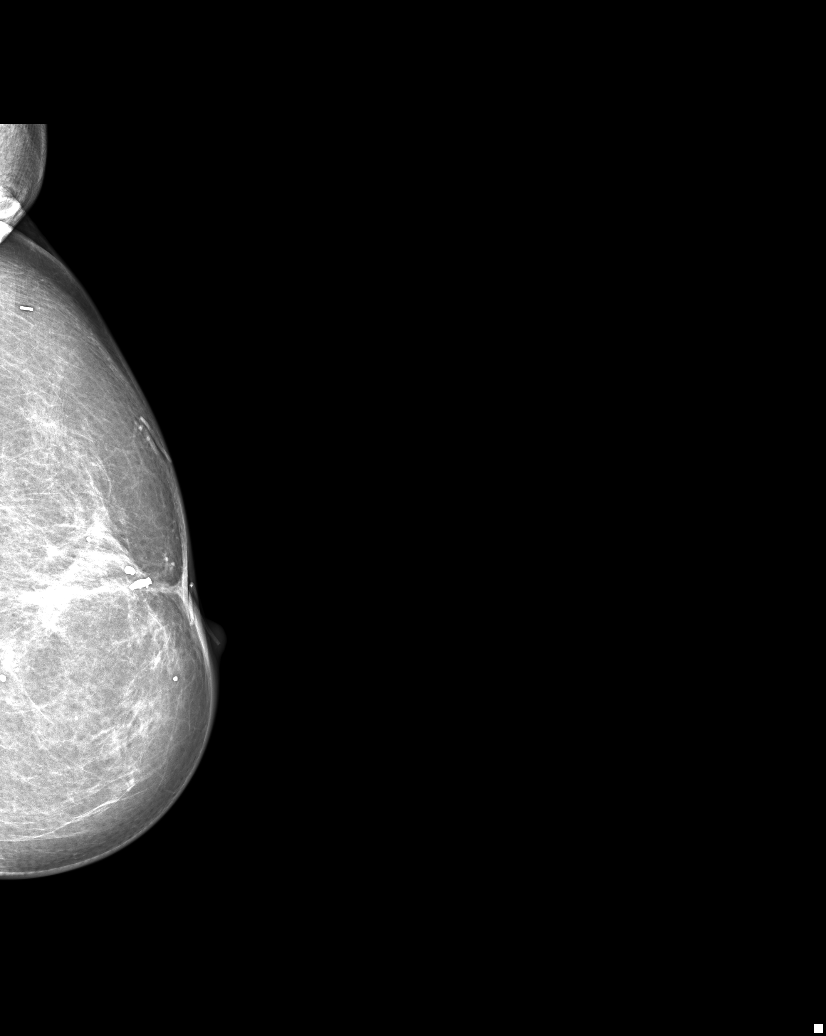

[5 of 5 positions shown; findings below may reference images not displayed]

EXAM
SCREENING MAMMOGRAM, BILATERAL

INDICATION
screening
LT BREAST CA 2779, LUMPECTOMY, RADIATION. BENIGN BX RT BREAST 2779. SCR. AB ([id number] NECK ROM,
STROKE [id number] LT ARM). PRIORS: [id number], OTHERS AT [facility].

TECHNIQUE
Bilateral craniocaudal and medial lateral oblique views were generated and reviewed with computer-
aided detection.

COMPARISONS
February 11, 2017

FINDINGS
Post therapeutic changes low of the left breast are re- demonstrated. No concerning masses or
calcifications are seen

IMPRESSION
Stable bilateral mammography. One year follow-up is recommended.
BI-RADS 2, BENIGN.

Tech Notes:

## 2018-04-24 IMAGING — CR LOW_EXM
3 series · 3 of 3 positions shown · non-contrast
Comparison: none

[foot]
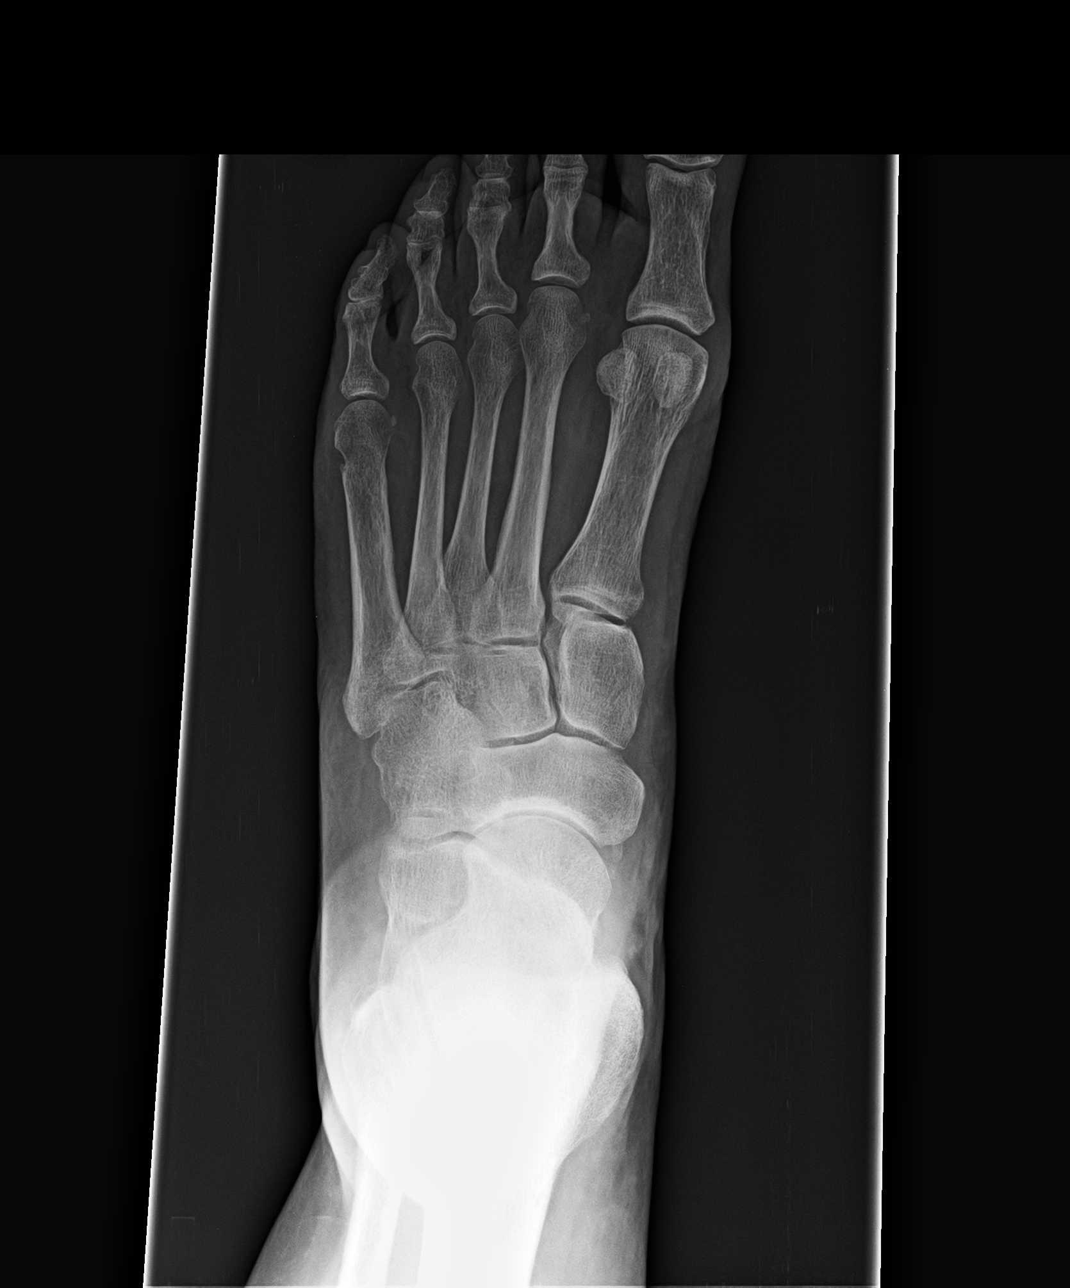

[foot obl]
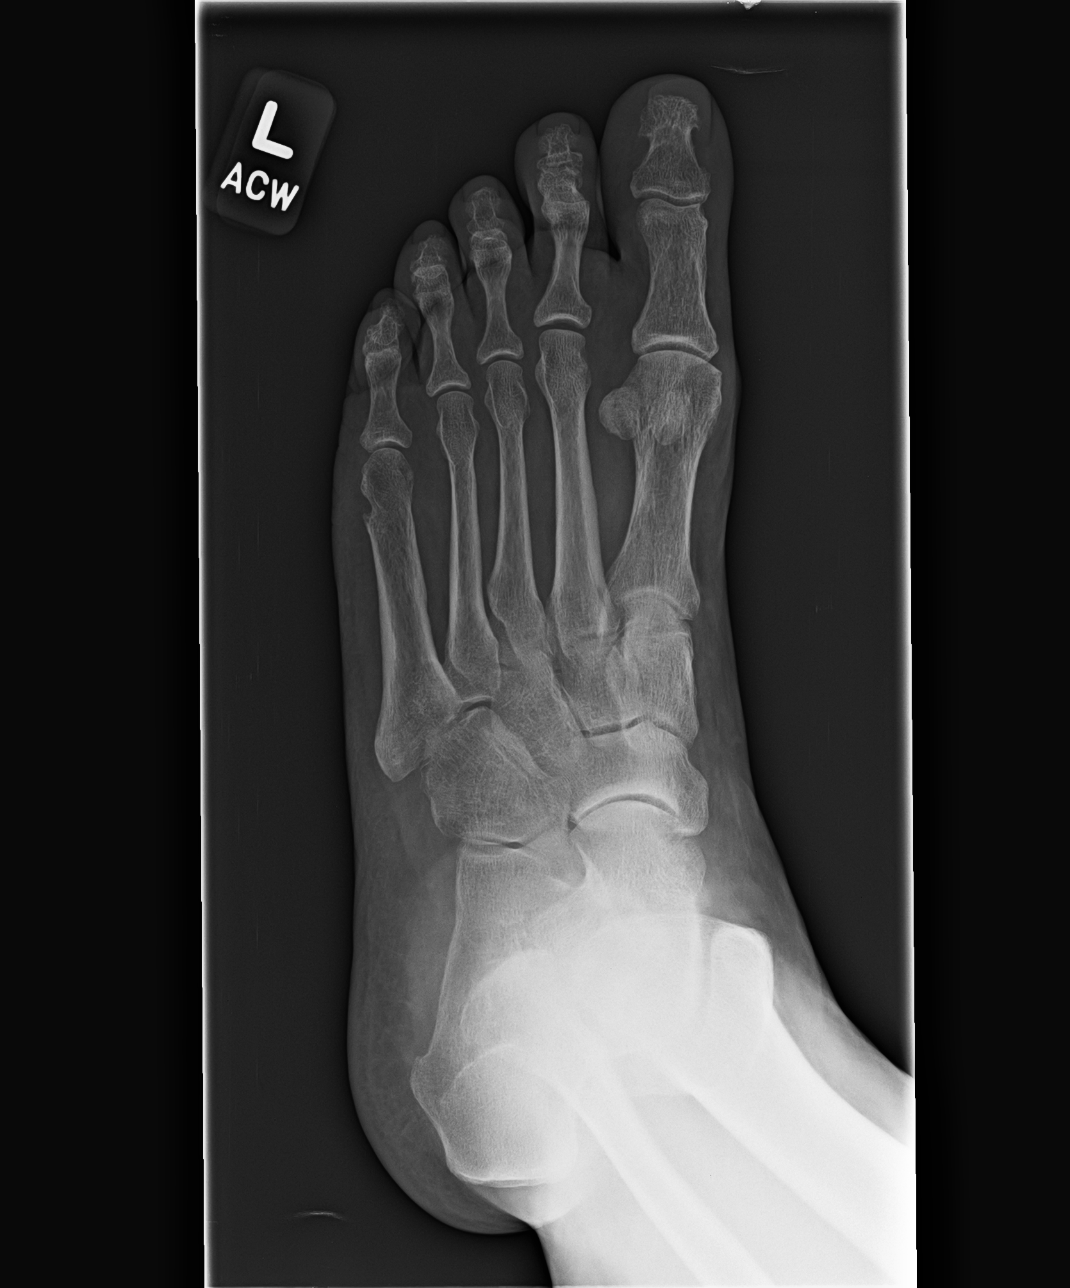

[foot lat]
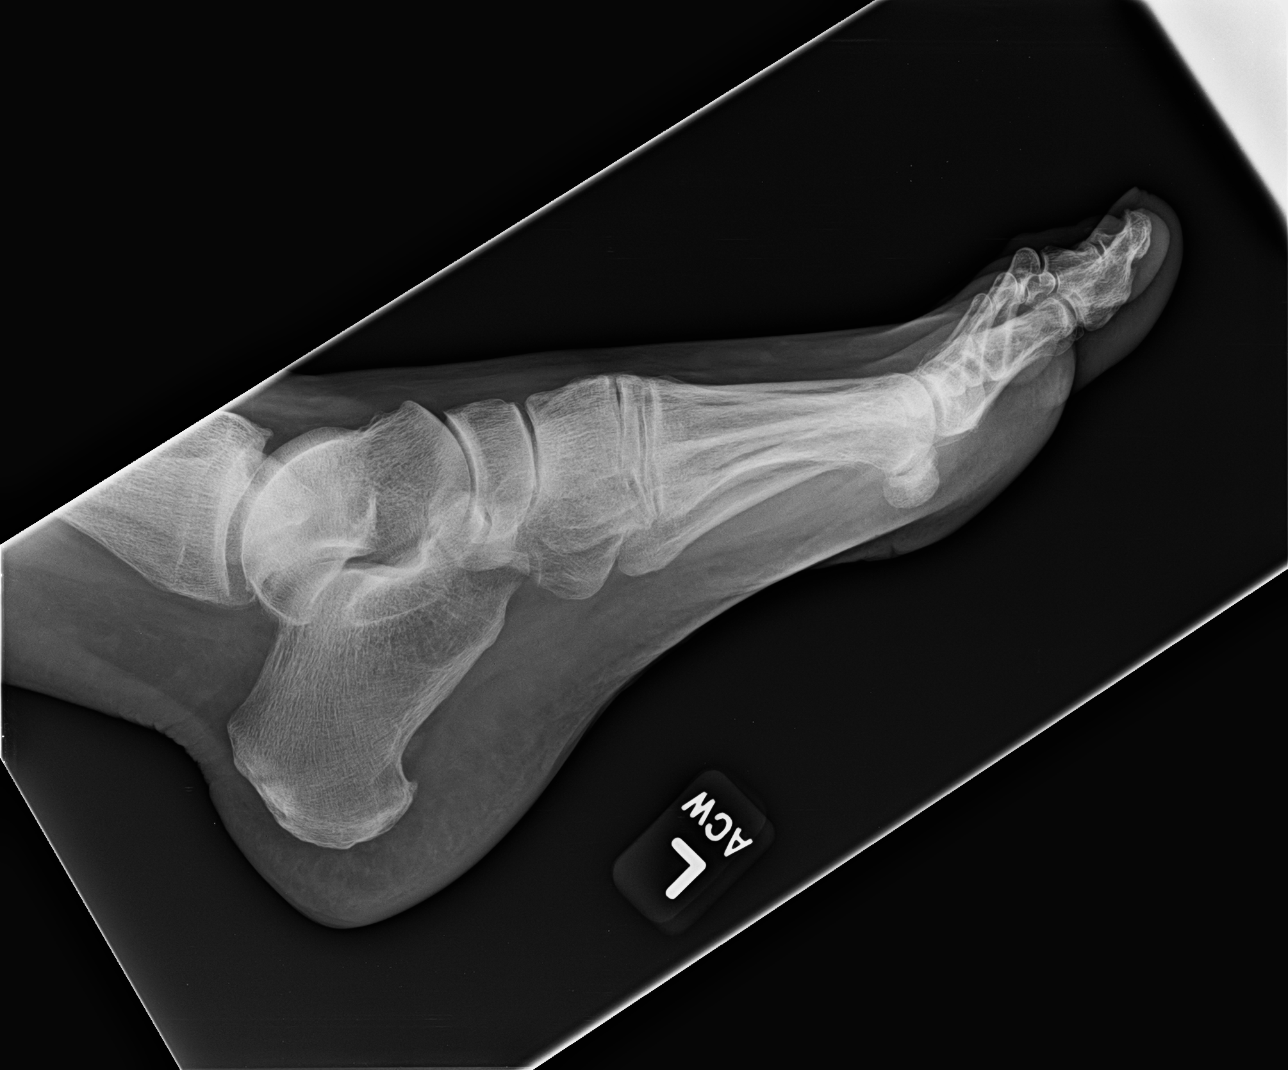

[3 of 3 positions shown; findings below may reference images not displayed]

EXAM

XR foot LT min 3V

INDICATION

Pain, swelling

TECHNIQUE

Three views left foot

COMPARISONS

None available at the time of dictation.

FINDINGS

Cortical irregularity along the distal lateral aspect of the 5th metatarsal head/meta diaphysis a
correlate for evidence of prior injury. There is also a subtle radiolucency in this region and
correlate for a subtle nondisplaced fracture. Overall suspicion is that imaging findings are likely
chronic in nature. There is diffuse osteopenia.

IMPRESSION
- Cortical irregularity along the distal lateral aspect of the 5th metatarsal head/neck extending
up to the meta diaphysis as detailed above.
- Correlate with point tenderness to assess for acuity.
- Overall suspicion for sequelae of prior injury.

Tech Notes:

Pt c/o pain and swelling in Lt foot. Stepped wrong onto lateral side. Hx of stroke w/ Lt sided
weakness. AW

## 2018-07-06 IMAGING — CR UP_EXM
2 series · 2 of 2 positions shown · non-contrast
Comparison: none

[hand]
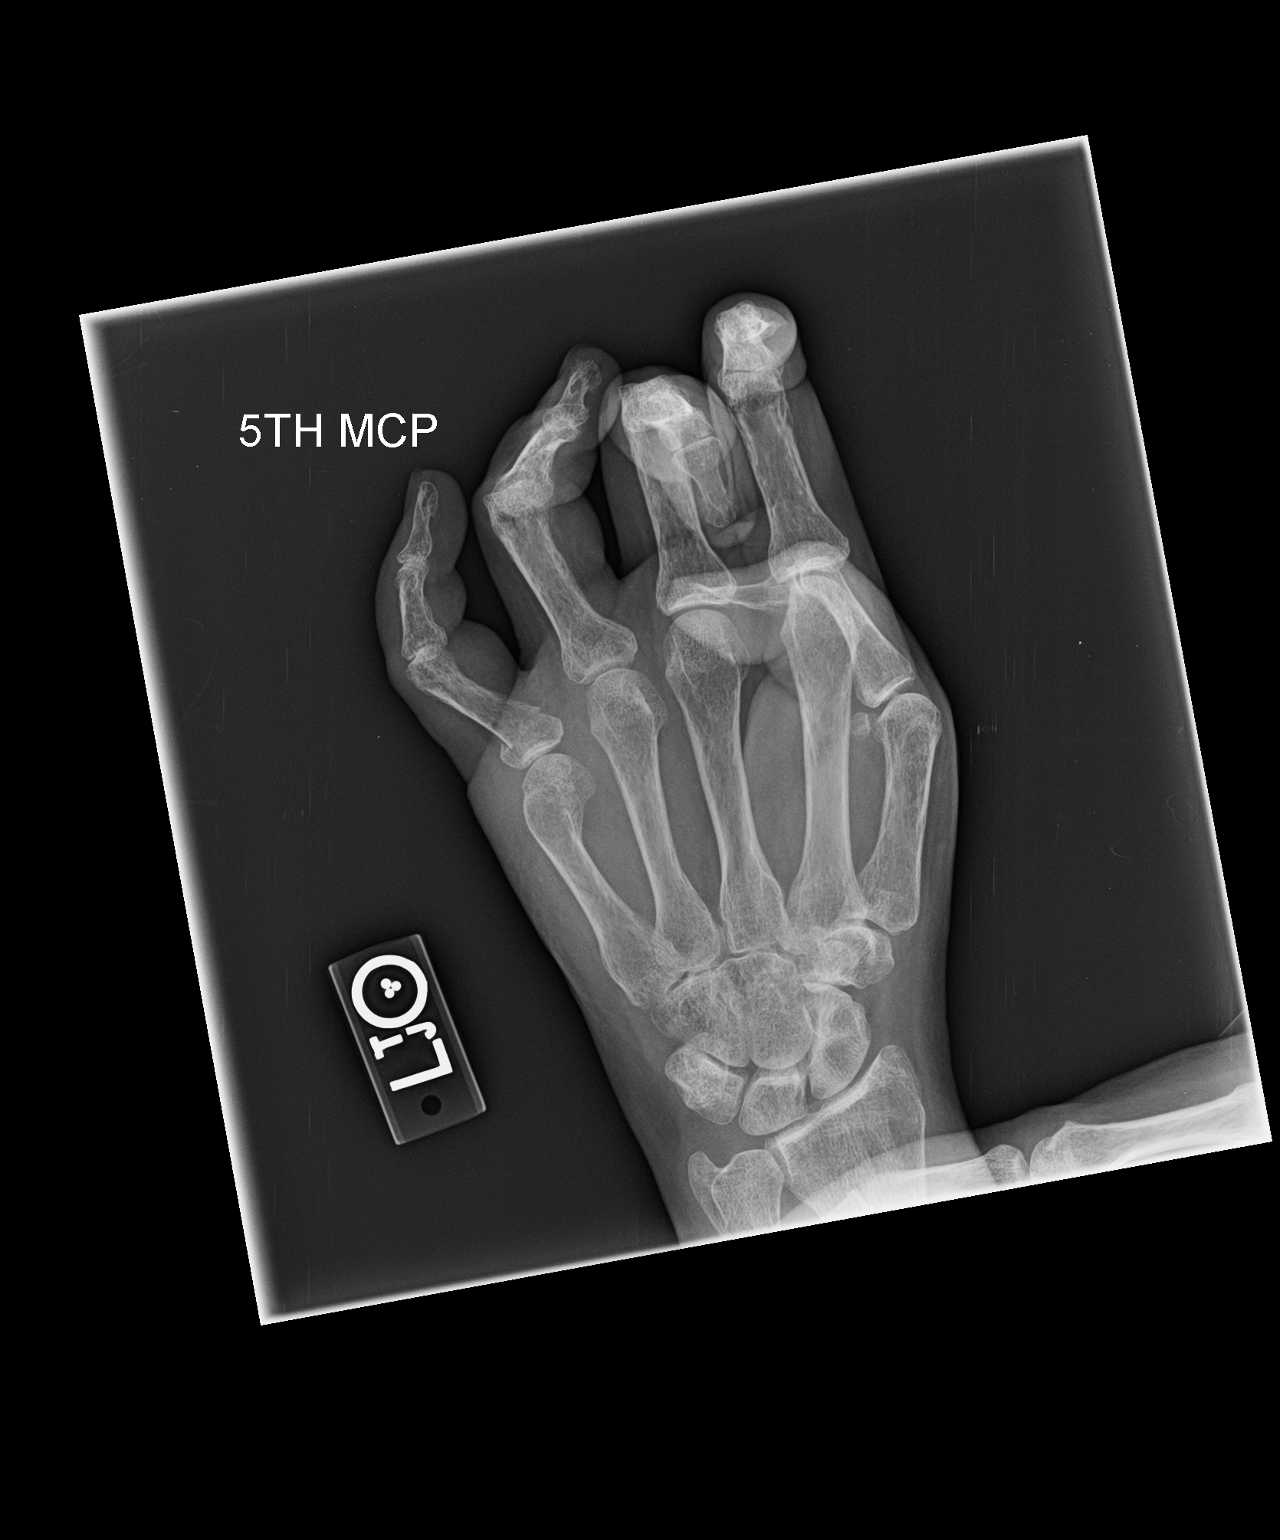

[hand lat]
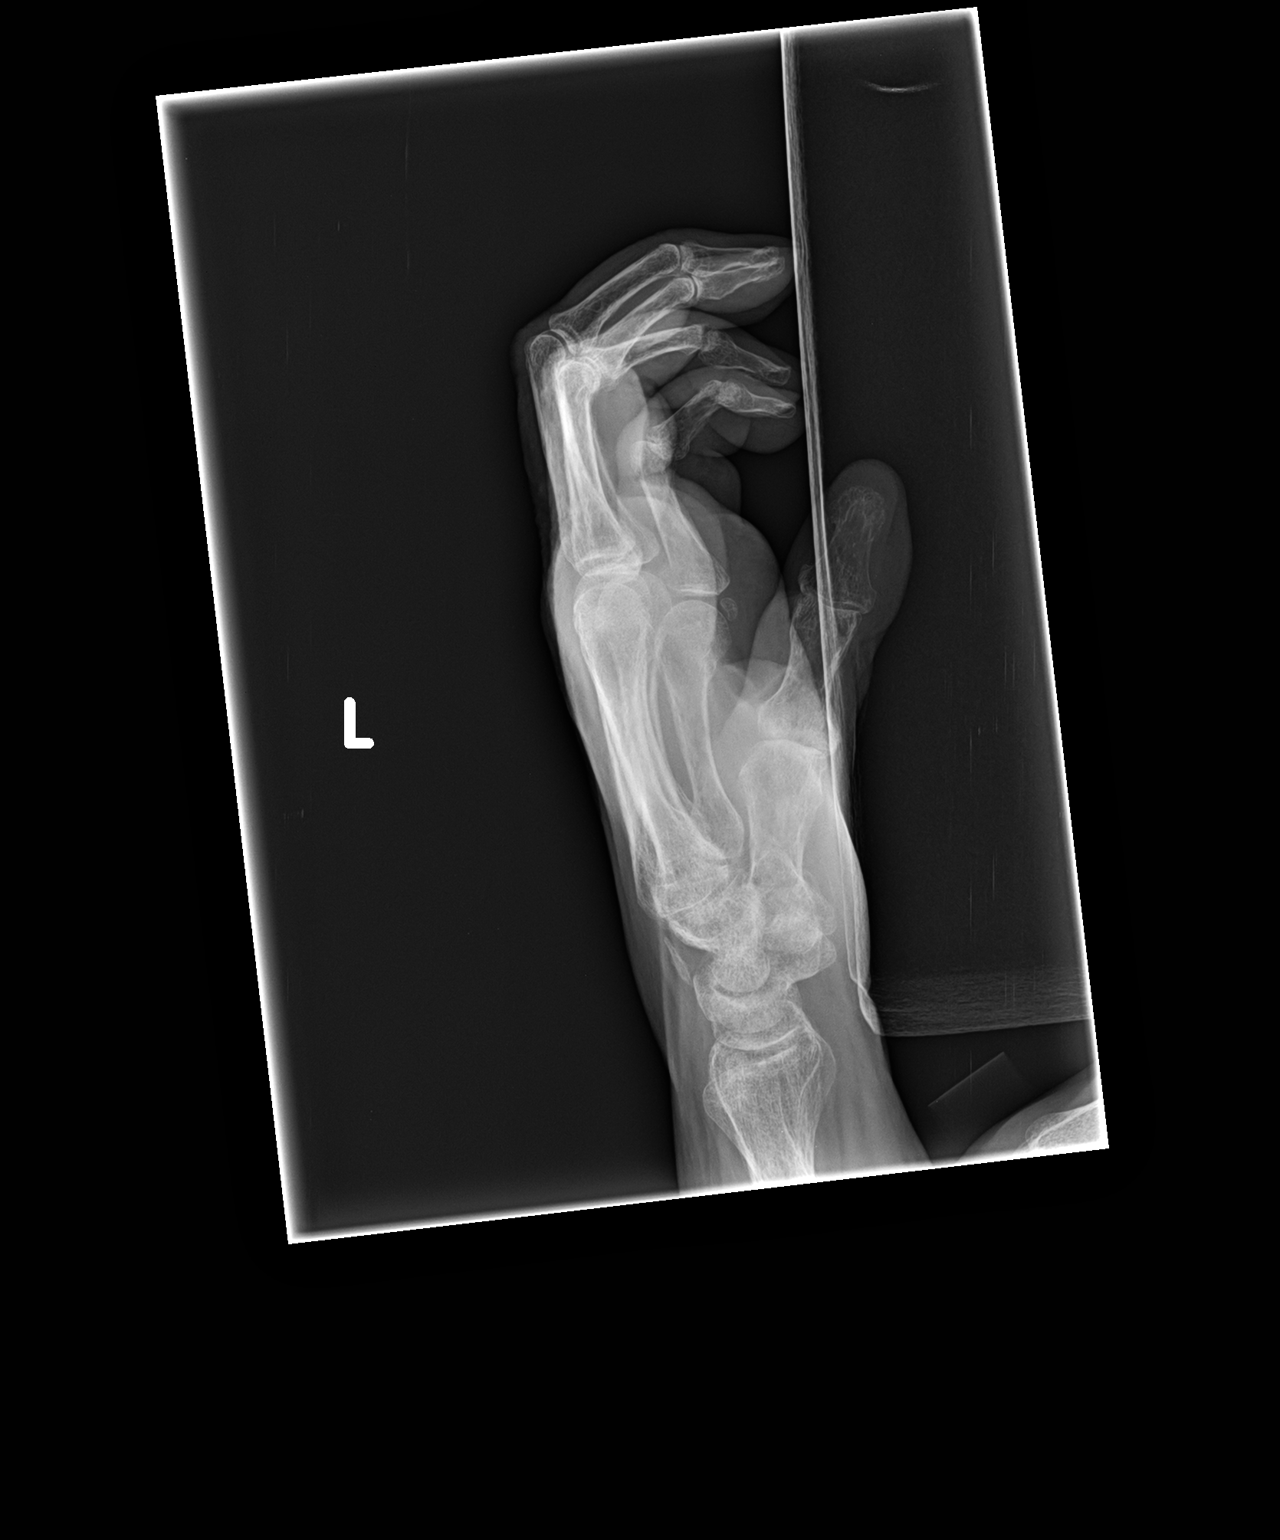

[2 of 2 positions shown; findings below may reference images not displayed]

Diagnostic Study

EXAM

Left hand.

INDICATION

Pain after fall involving the 5th metacarpophalangeal joint. The hand is contracture chronically.

FINDINGS

The bone density is diminished.

There is a slightly angulated and displaced fracture involving the base of the left 5th proximal
phalanx.  No other displaced fractures are seen. There is no dislocation.

There is soft tissue swelling.

IMPRESSION

There is soft tissue swelling and osteopenia.

There is a slightly angulated and displaced fracture involving the left 5th proximal phalanx.

Tech Notes:

PT c/o fall, pain in LT hand 5th MCP region. H/O stroke, LT hand contracture. JC/TJ

## 2018-08-04 IMAGING — CR UP_EXM
3 series · 3 of 3 positions shown · non-contrast
Comparison: none

[hand]
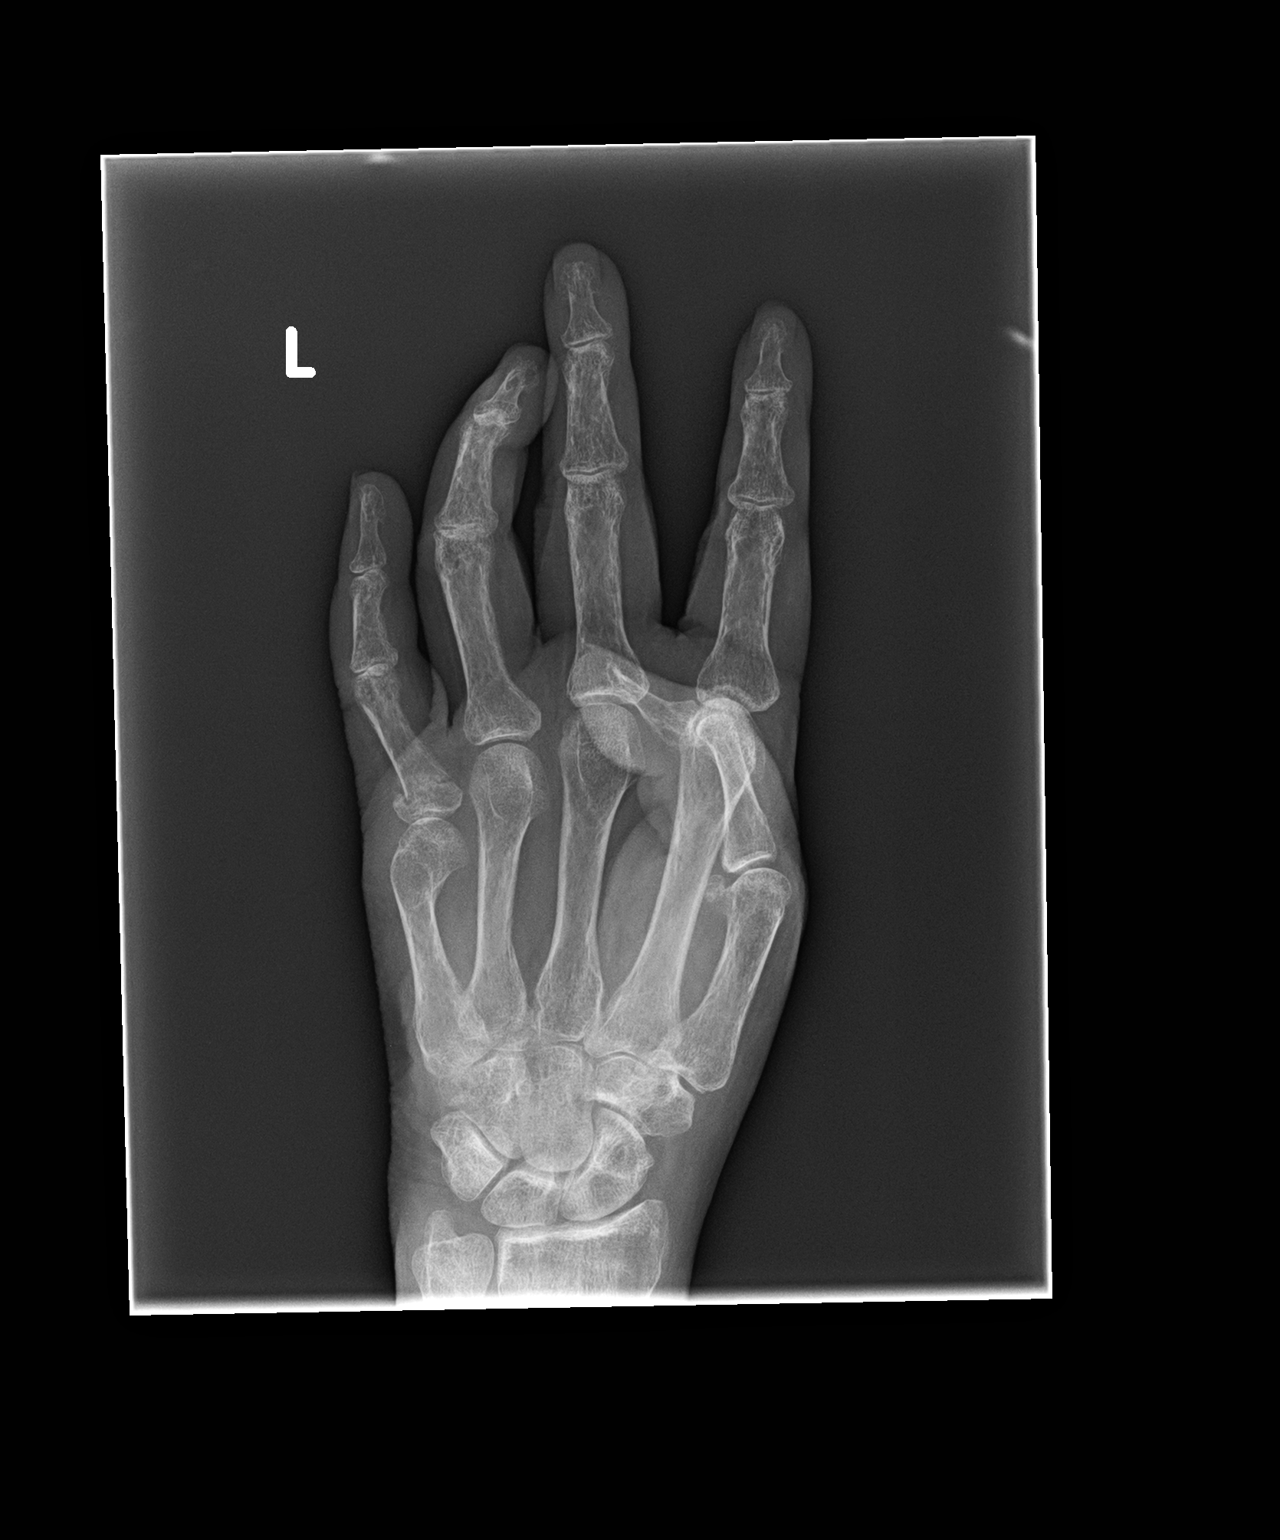

[hand obl]
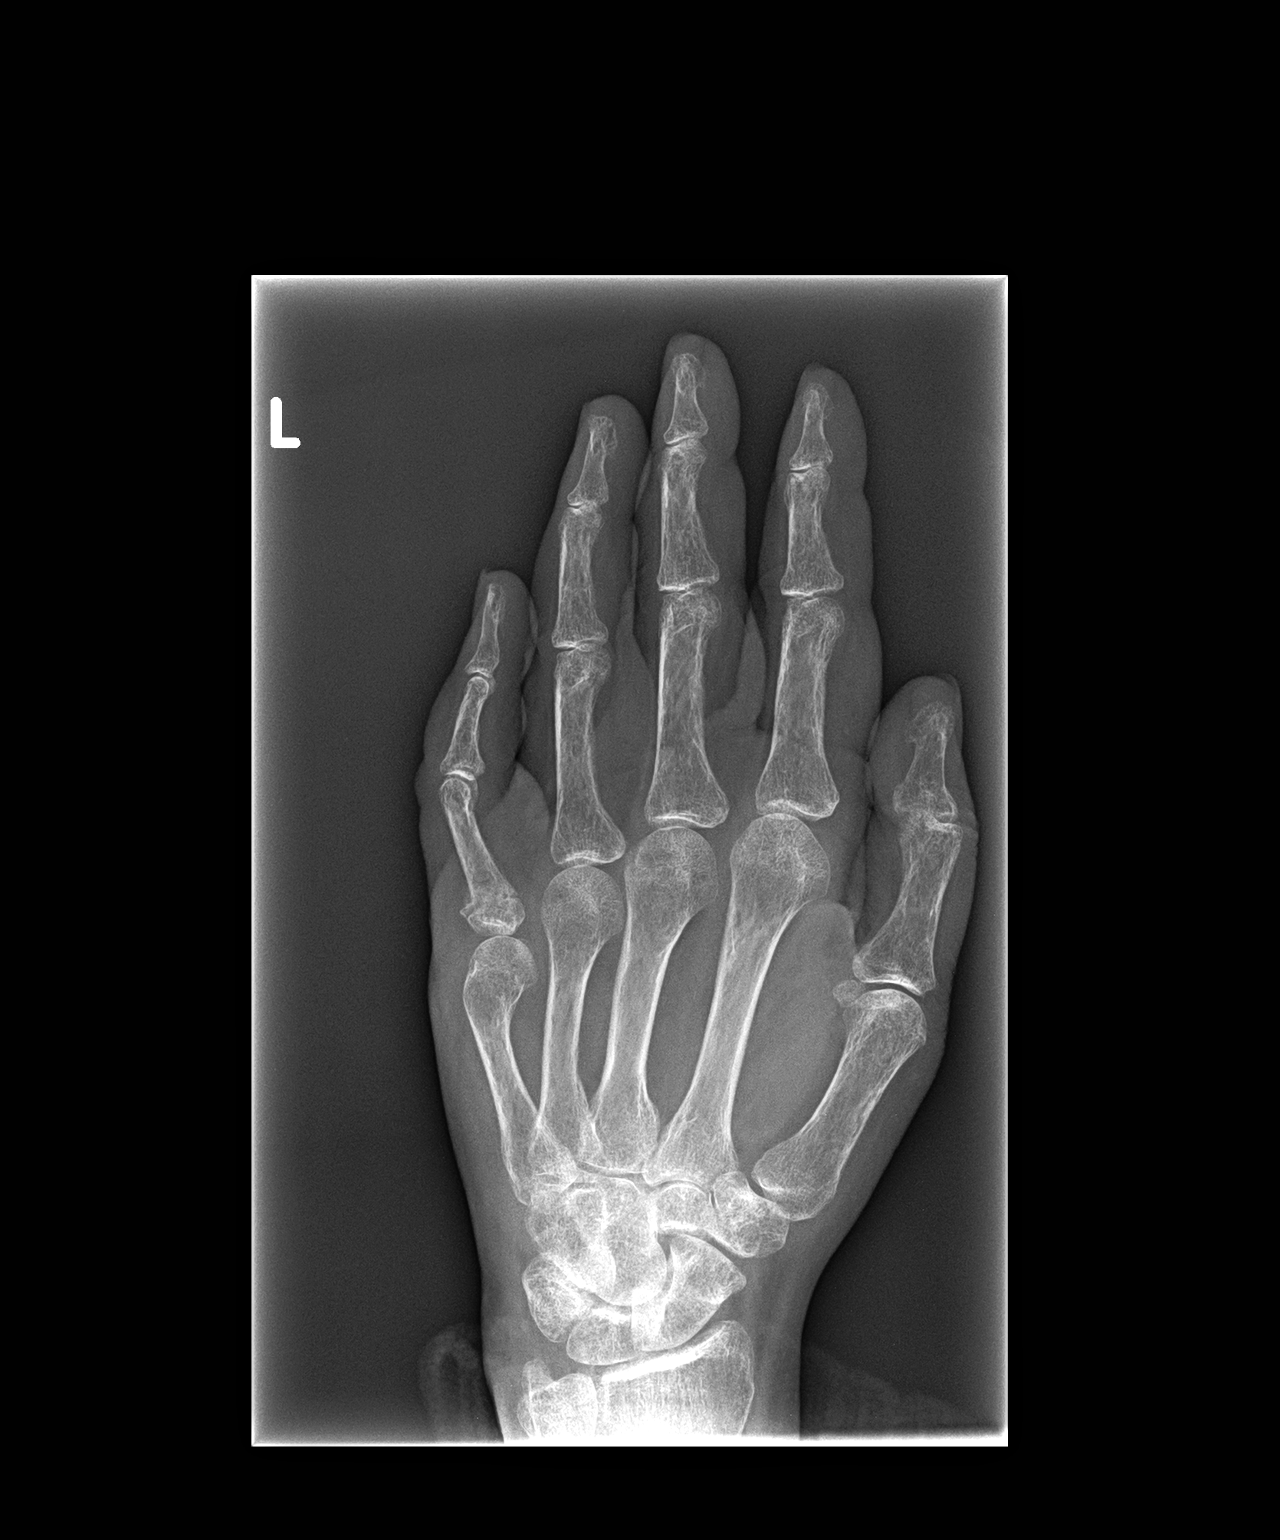

[hand lat]
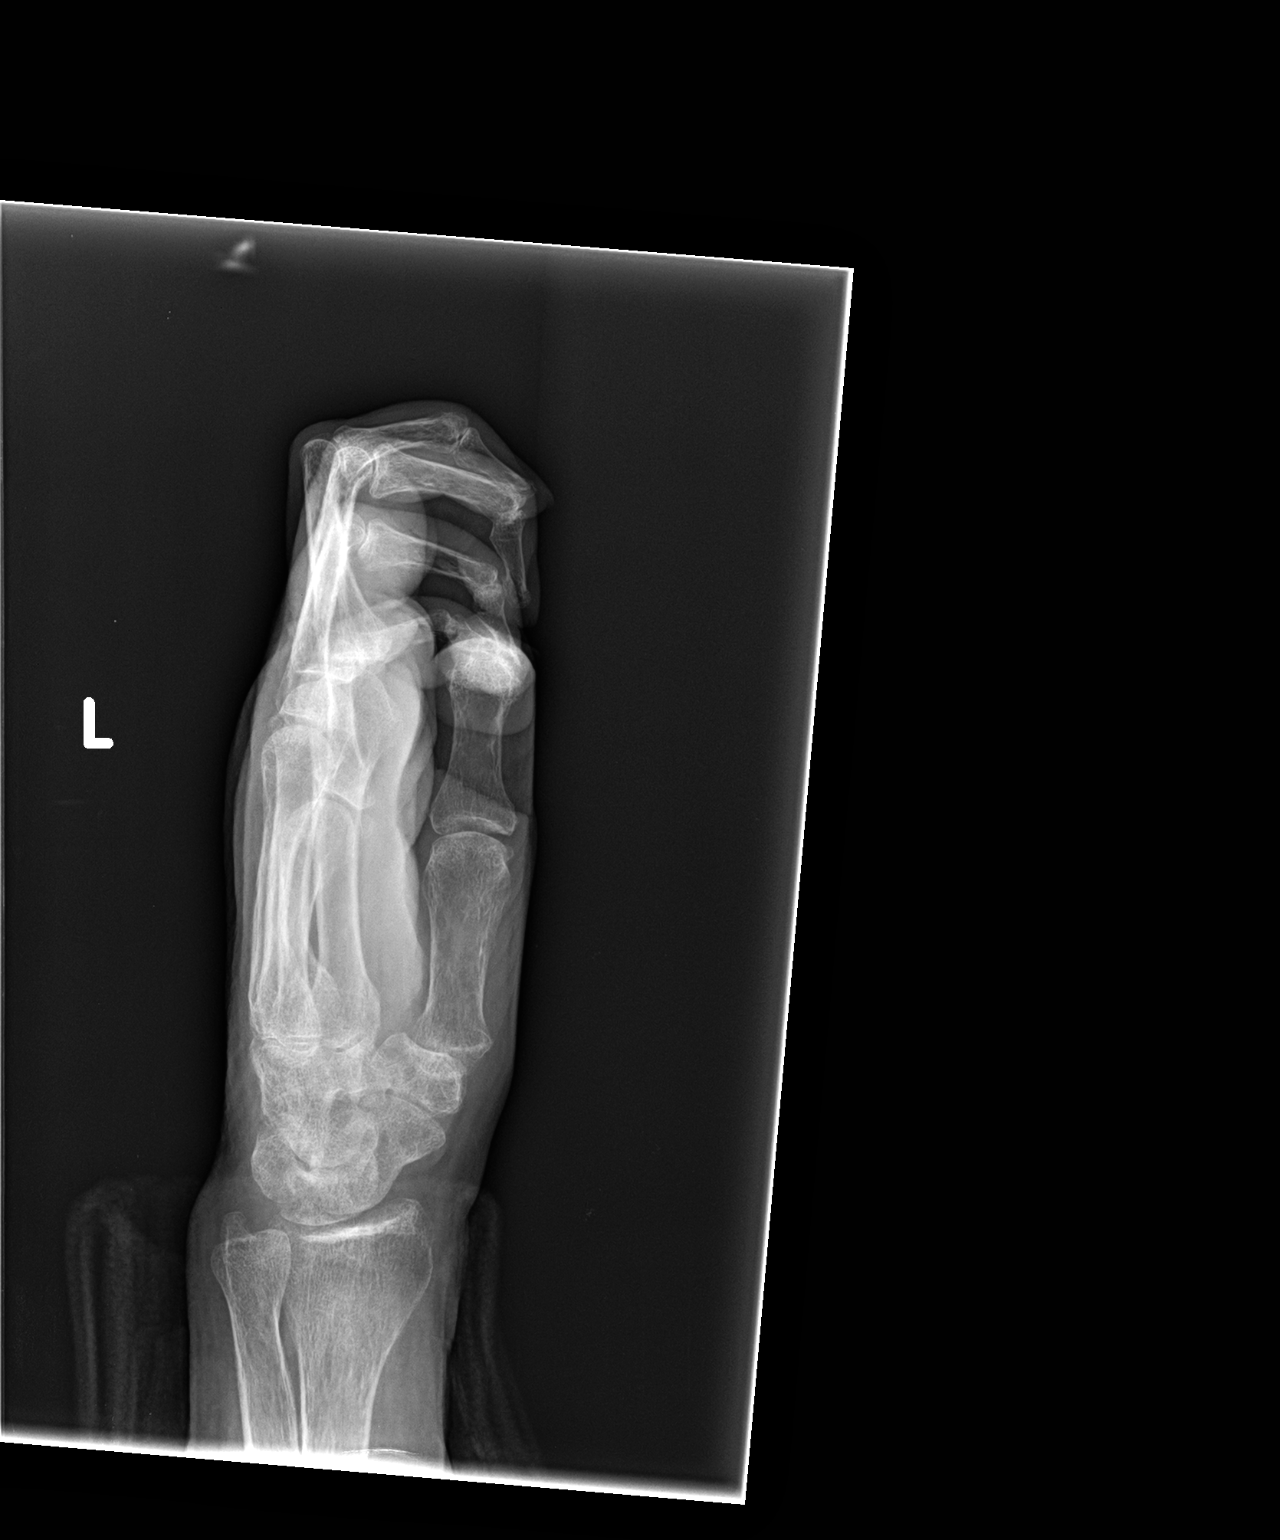

[3 of 3 positions shown; findings below may reference images not displayed]

Diagnostic Study

EXAM

Left hand.

INDICATION

5th proximal phalanx fracture.

FINDINGS

There is osteopenia.

Again the fracture at the base of the left 5th proximal phalanx is identified.  It remains slightly
angulated.  Some early interval healing is identified.

Bold deformity of the left 5th meta carpal is identified.

IMPRESSION

There is osteopenia.

There has been some slight interval healing of the mildly angulated fracture of the left 5th
proximal phalanx.

Tech Notes:

f/u fx

## 2018-09-27 IMAGING — CR LOW_EXM
3 series · 3 of 3 positions shown · non-contrast
Comparison: none

[foot]
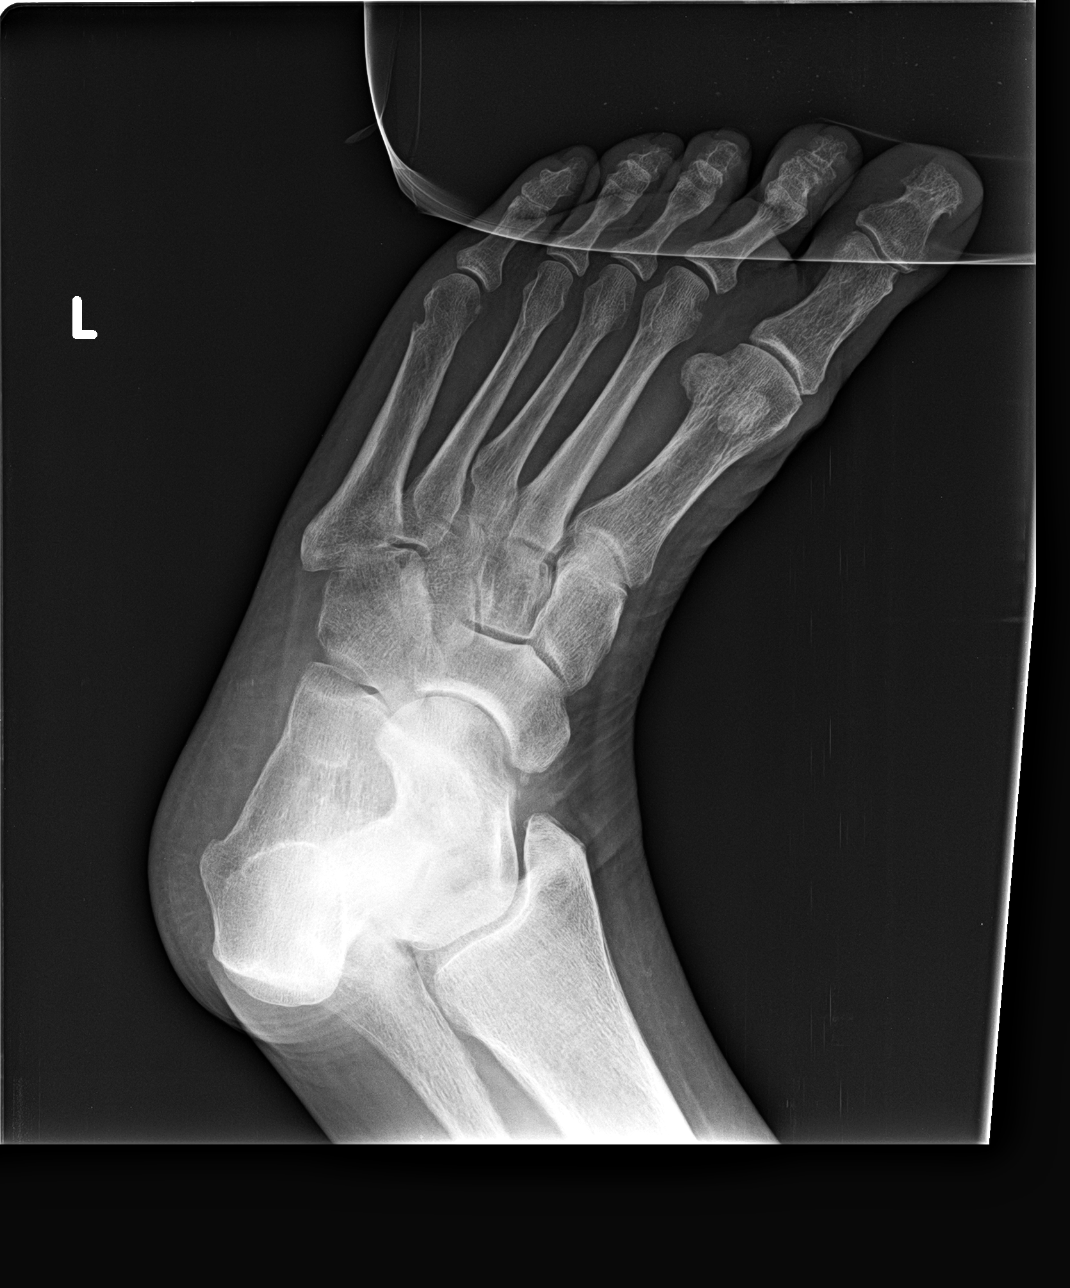

[foot obl]
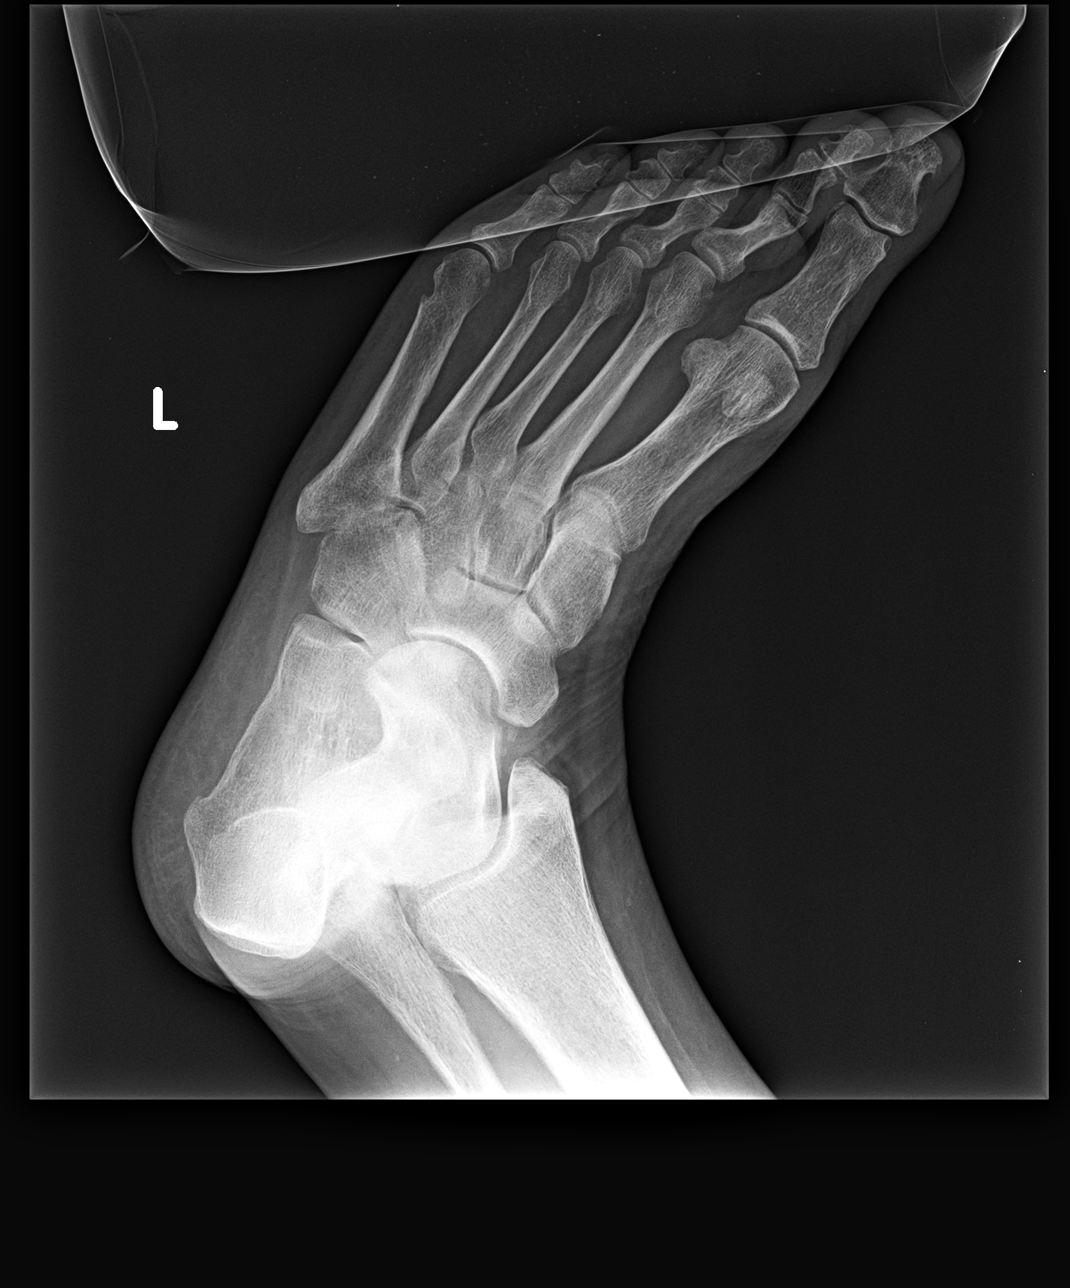

[foot lat]
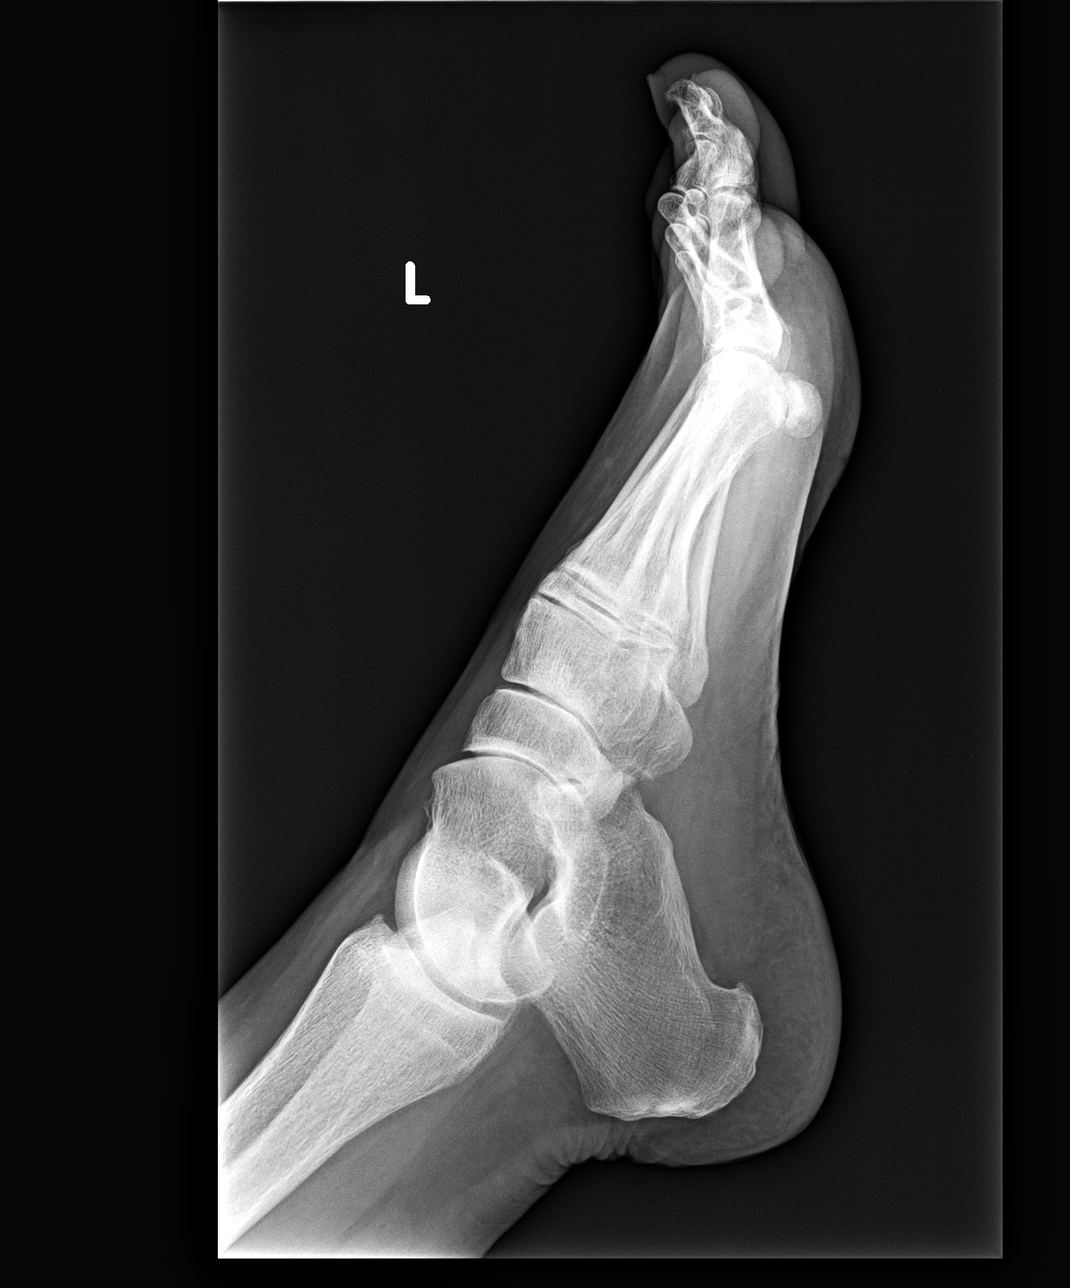

[3 of 3 positions shown; findings below may reference images not displayed]

DIAGNOSTIC STUDIES

EXAM

RADIOLOGICAL EXAMINATION, BILATERAL KNEES, STANDING, ANTEROPOSTERIOR CPT 73313

INDICATION

Worsening proximale 1st and 2nd metarsal pain
PAIN FROM PLANTAR FOOT RADIATION TO KNEE.  PT HAS LEFT SIDED PARAESTHESIA DUE TO STROKE.  VERY
LIMITED ROM, UNABLE TO HOLD FOOT STILL WITHOUT WEIGHT ON IT.

TECHNIQUE

Three views of the left foot were obtained

COMPARISONS

None

FINDINGS

The bone density is mildly diminished.

There is no displaced fracture. There is no dislocation. No abnormal soft tissue swelling is
identified.

There is minimal calcaneal spurring.

IMPRESSION

There is no displaced fracture or acute bony abnormality

Tech Notes:

PAIN FROM PLANTAR FOOT RADIATION TO KNEE.  PT HAS LEFT SIDED PARAESTHESIA DUE TO STROKE.  VERY
LIMITED ROM, UNABLE TO HOLD FOOT STILL WITHOUT WEIGHT ON IT.

## 2019-03-17 IMAGING — MG MAMMOGRAM 3D SCREEN, LEFT
8 series · 8 of 8 positions shown · non-contrast
Comparison: none

[L CC (1 of 2)]
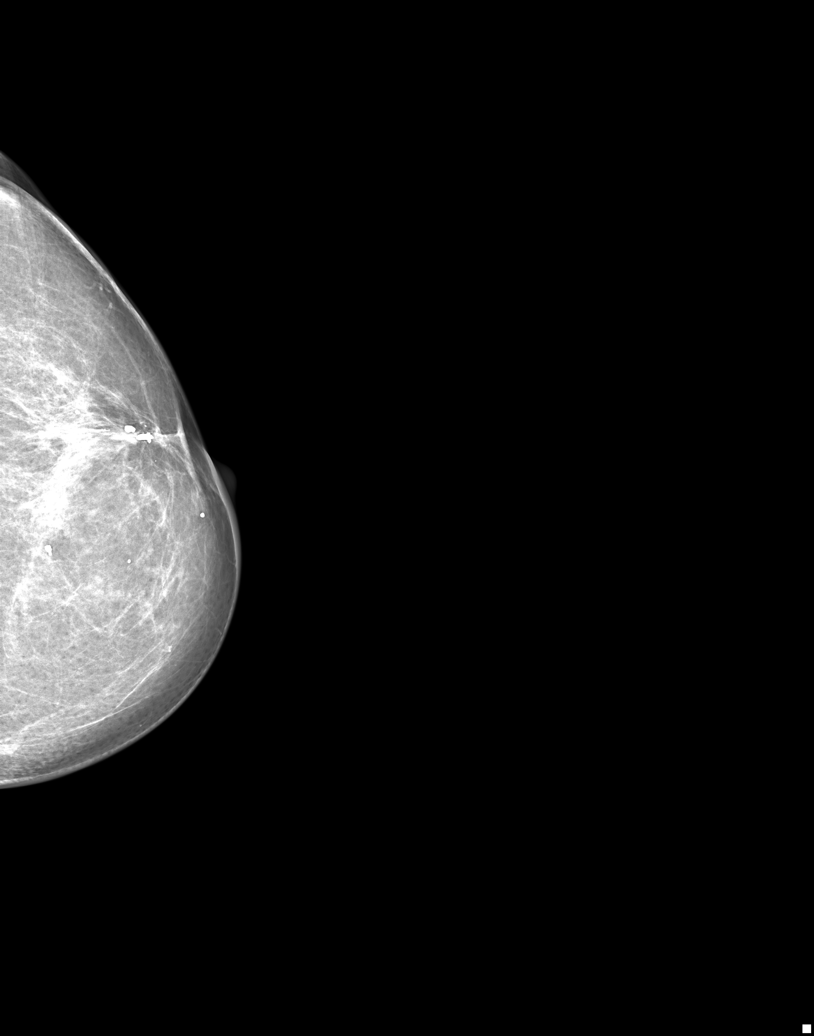

[L tomo (1 of 2)]
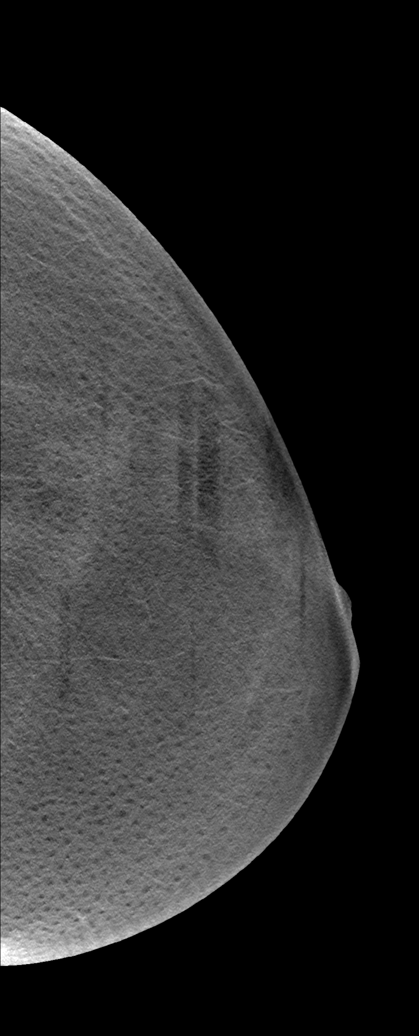

[L CC (2 of 2)]
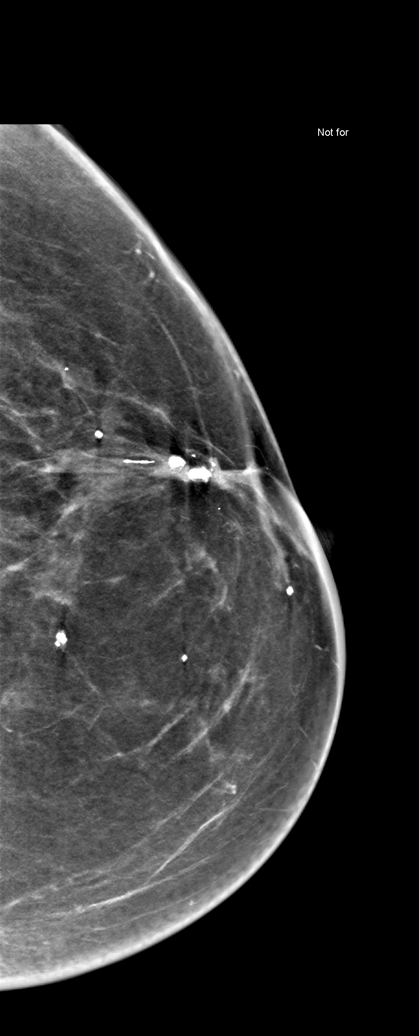

[L (1 of 2)]
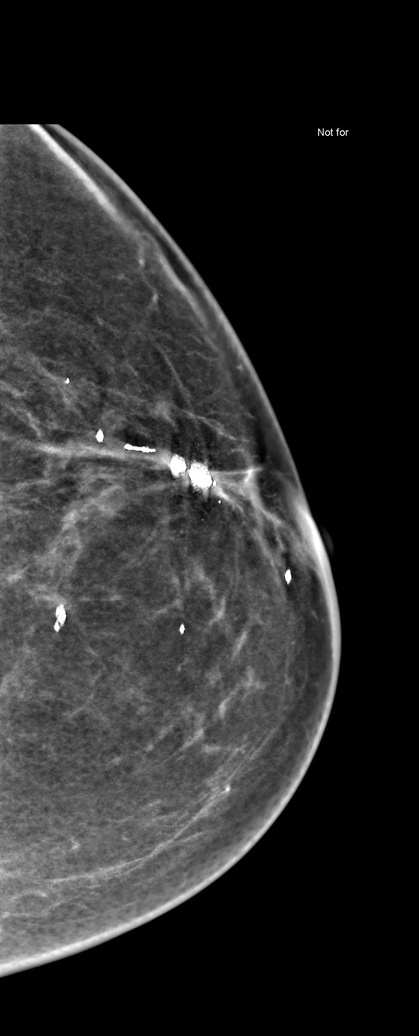

[L MLO (1 of 2)]
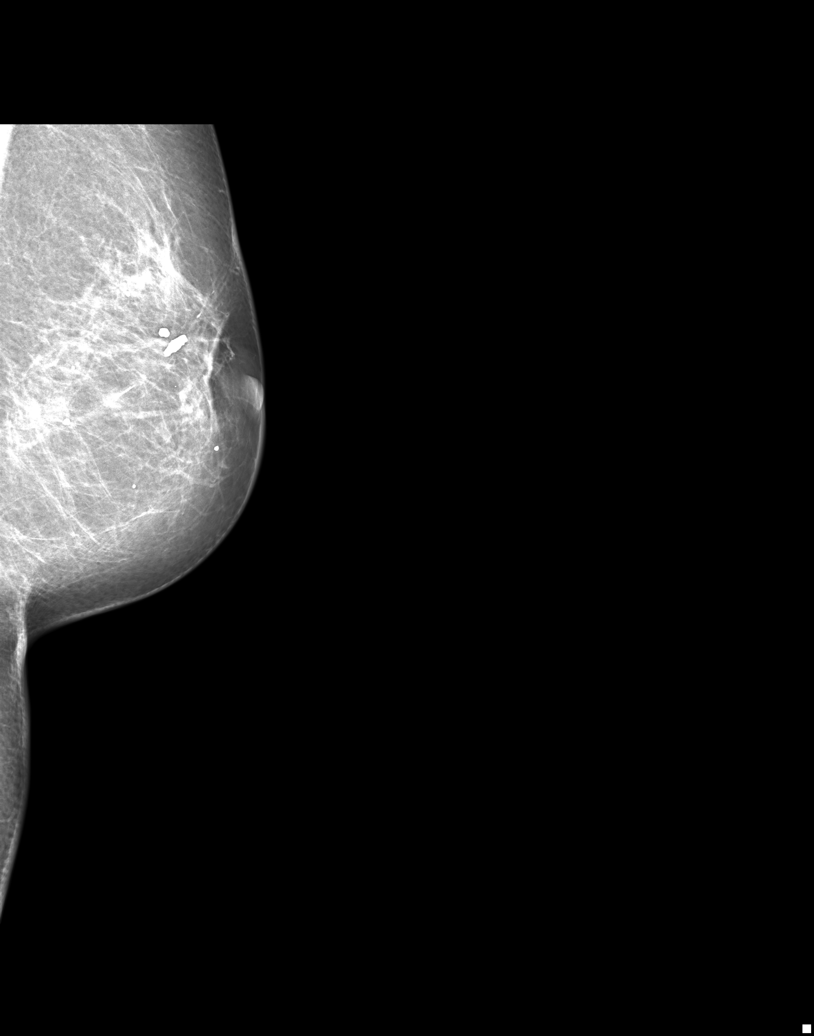

[L tomo (2 of 2)]
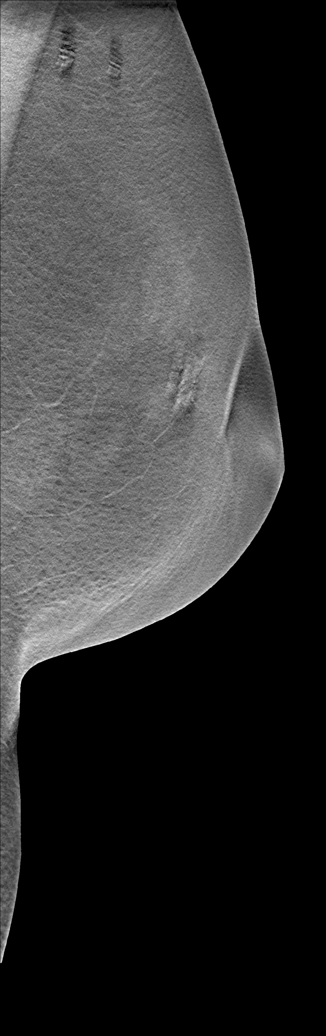

[L MLO (2 of 2)]
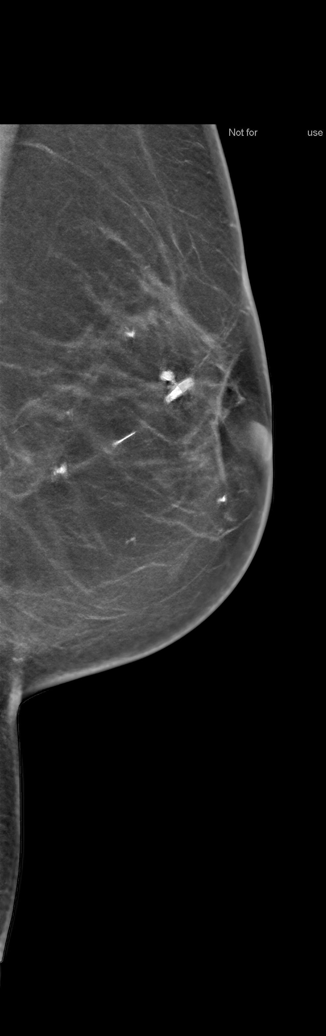

[L (2 of 2)]
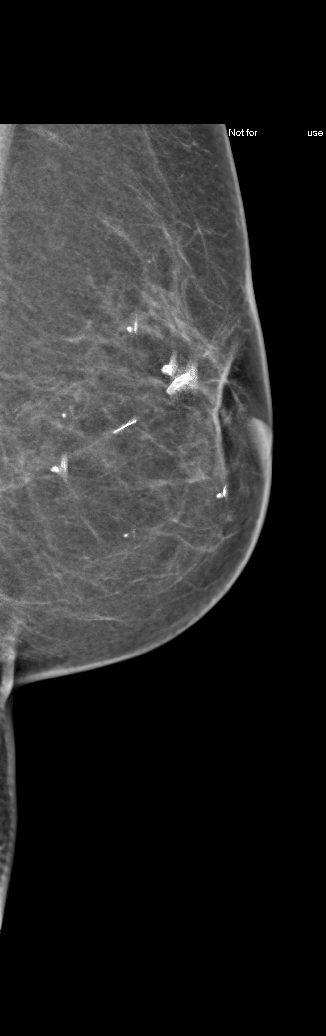

[8 of 8 positions shown; findings below may reference images not displayed]

EXAM

3D SCREENING MAMMOGRAM, unilateral

INDICATION

screening
SCR 3D.LEFT BREAST. RECENT RT BREAST LIFT. HX OF LT BR CA IN 9114 WITH LUMPECTOMY AND RADIATION. 2
SISTERS WITH BREAST CANCER. LM

TECHNIQUE

Digital 2D CC and MLO projections obtained with 3D tomographic views per manufacturer's protocol.
ICAD version 7.2 was used during this exam.

COMPARISONS

Thursday February, 2018

FINDINGS

ACR Type 2:  25-50% There are scattered fibroglandular densities.

Post post therapeutic changes of the left breast are noted. No concerning masses or calcifications
are seen.

IMPRESSION

Stable left mammogram. One year follow-up is recommended.

BI-RADS 2, BENIGN.

Tech Notes:

## 2019-08-29 ENCOUNTER — Encounter: Admit: 2019-08-29 | Discharge: 2019-08-29 | Payer: MEDICARE

## 2019-08-29 ENCOUNTER — Ambulatory Visit: Admit: 2019-08-29 | Discharge: 2019-08-30 | Payer: MEDICARE

## 2019-08-29 DIAGNOSIS — H04123 Dry eye syndrome of bilateral lacrimal glands: Secondary | ICD-10-CM

## 2019-08-29 DIAGNOSIS — H53452 Other localized visual field defect, left eye: Secondary | ICD-10-CM

## 2019-08-29 DIAGNOSIS — I10 Essential (primary) hypertension: Secondary | ICD-10-CM

## 2019-08-29 DIAGNOSIS — H527 Unspecified disorder of refraction: Secondary | ICD-10-CM

## 2019-08-29 DIAGNOSIS — I639 Cerebral infarction, unspecified: Secondary | ICD-10-CM

## 2019-08-29 DIAGNOSIS — H2513 Age-related nuclear cataract, bilateral: Secondary | ICD-10-CM

## 2019-08-29 NOTE — Progress Notes
Body mass index is 25.1 kg/m.       VISUAL FIELD, EXTEND     Visual Field Examination Type  Hum[facility]rey Automated  Laterality   Both eyes  Right Eye  Threshold: 24-2  Strategy:  SITA StandardStimulus Size:  IIIStimulus Color:  WhiteReliability:  GoodGlaucoma Hemifield Test:  Within normal limitsResults:  Normal  Left Eye  Threshold: 24-2  Strategy:  SITA StandardStimulus Size:  IIIStimulus Color:  WhiteReliability:  GoodGlaucoma Hemifield Test:  Outside normal limitsLocation:  RightDescription:  Nasal Step              Assessment and Plan:    Problem   Dry Eye Syndrome of Both Eyes   Age-Related Nuclear Cataract of Both Eyes   Refraction Error          This nice lady with history of CVA 27 years ago and LASIK today demonstrates normal visual fields in both eyes, bilateral dry eye, and modest cataracts that are not visually significant.  We've given her updated glasses prescription with which she sees 20/20 OU, and suggested use of artificial tears prn.  She can f/up with her usual optometrist/ophthalmologist.

## 2019-11-16 IMAGING — CR LOW_EXM
1 series · 1 of 1 positions shown · non-contrast
Comparison: none

[ap bilateral standing knees]
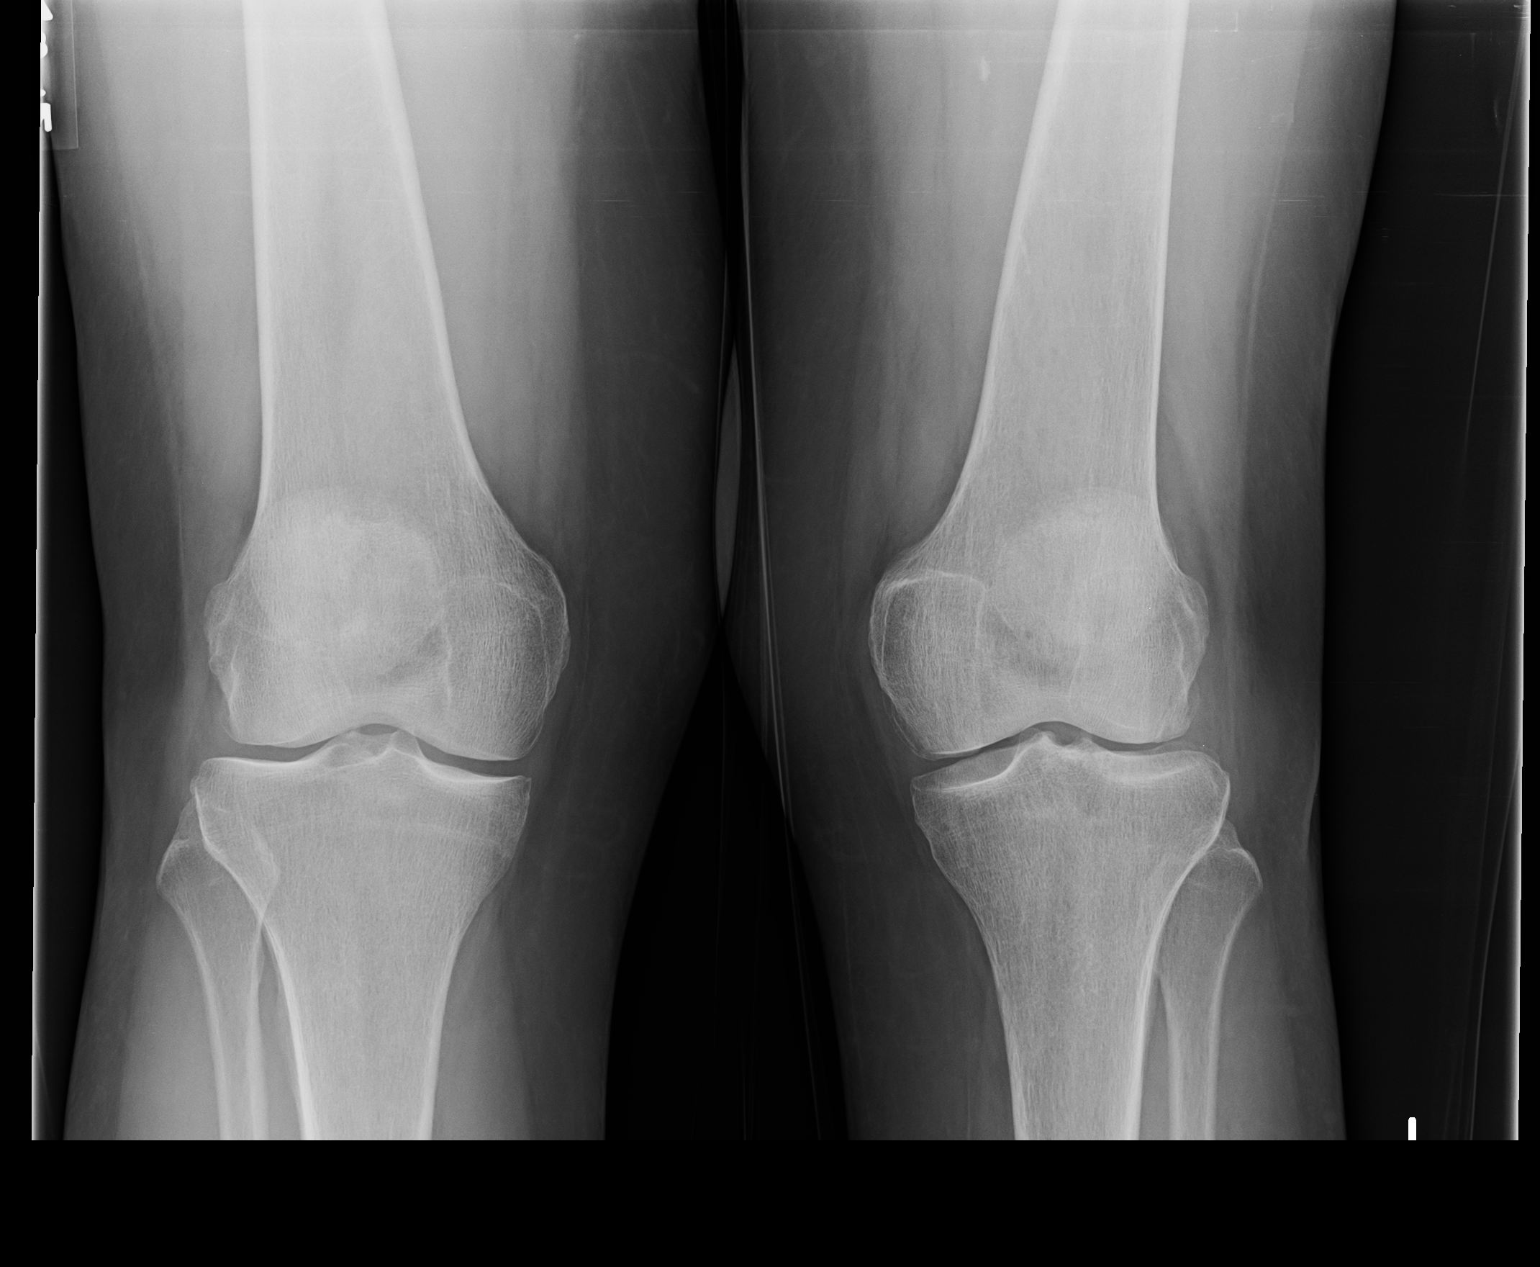

[1 of 1 positions shown; findings below may reference images not displayed]

EXAM

XR knee standing BI, XR right knee, XR left knee

INDICATION

knee pain
Patient c/o bilateral knee pain. Patient states she has fallen multiple times on her knees. Patient
also states her left knee has more pain and has
been hurting for about 5-6 years and the right knee has been having pain off and (..)

TECHNIQUE

Two views of the right knee. Two views of the left knee. AP weight-bearing views of both knees.

COMPARISONS

None available at the time of dictation.

FINDINGS

No radiographic evidence of an acute fracture, osseous malalignment, or aggressive focal osseous
lesion. No joint effusion. There is anatomic joint alignment. Small right quadriceps insertional
enthesophytes. Asymmetric decreased bone mineralization of the left knee when compared to the right,
which may be artifactual though correlate with clinical exam. Vascular calcifications.

On sunrise view, minimal marginal spurring in the patellofemoral compartment involving the medial
trochlear ridge of the left knee.

No radiographic evidence of significant joint space loss.

IMPRESSION
1. No radiographic evidence of an acute osseous abnormality or significant degenerative change.
2. Nonspecific asymmetric minimally decreased bone mineralization of the left knee when compared to
the right, which may be artifactual.

Tech Notes:

Patient c/o bilateral knee pain. Patient states she has fallen multiple times on her knees. Patient
also states her left knee has more pain and has been hurting for about 5-6 years and the right knee
has been having pain off and on for about 6 months. BM/TB

## 2019-11-16 IMAGING — CR LOW_EXM
2 series · 2 of 2 positions shown · non-contrast
Comparison: none

[knee lat]
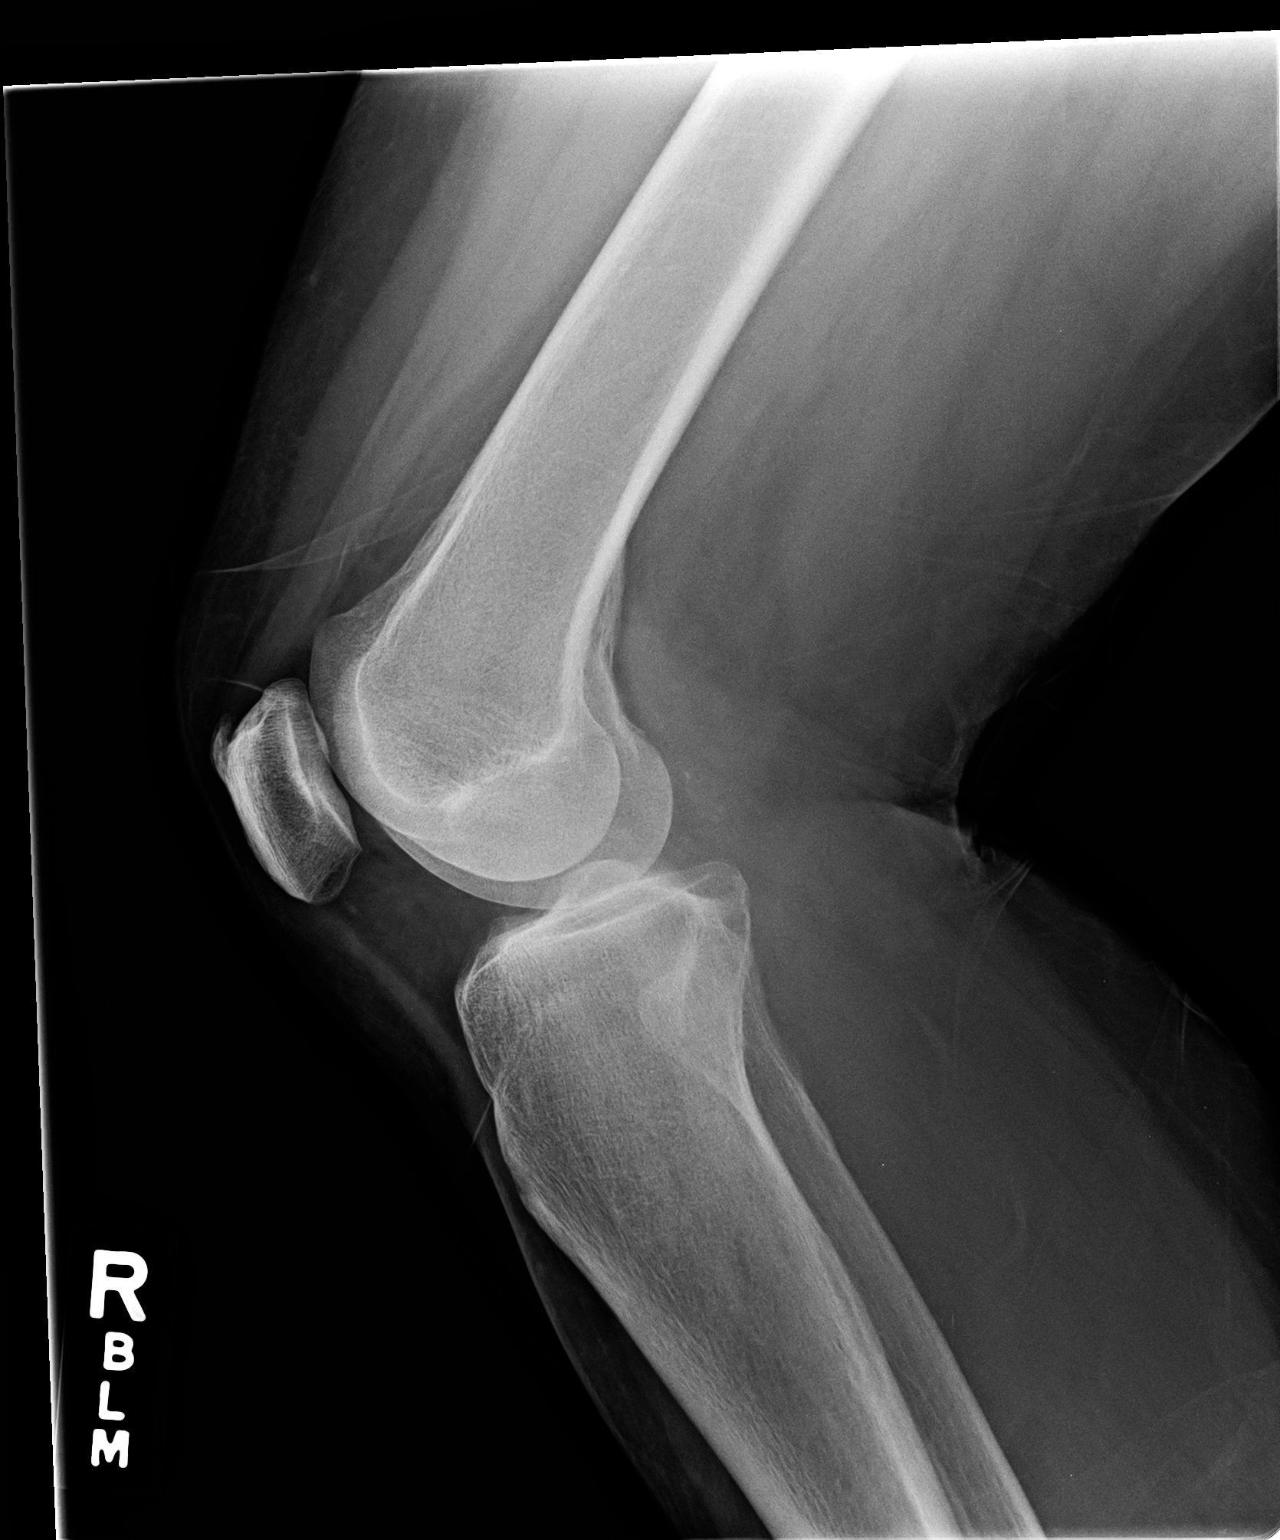

[knee sunrise]
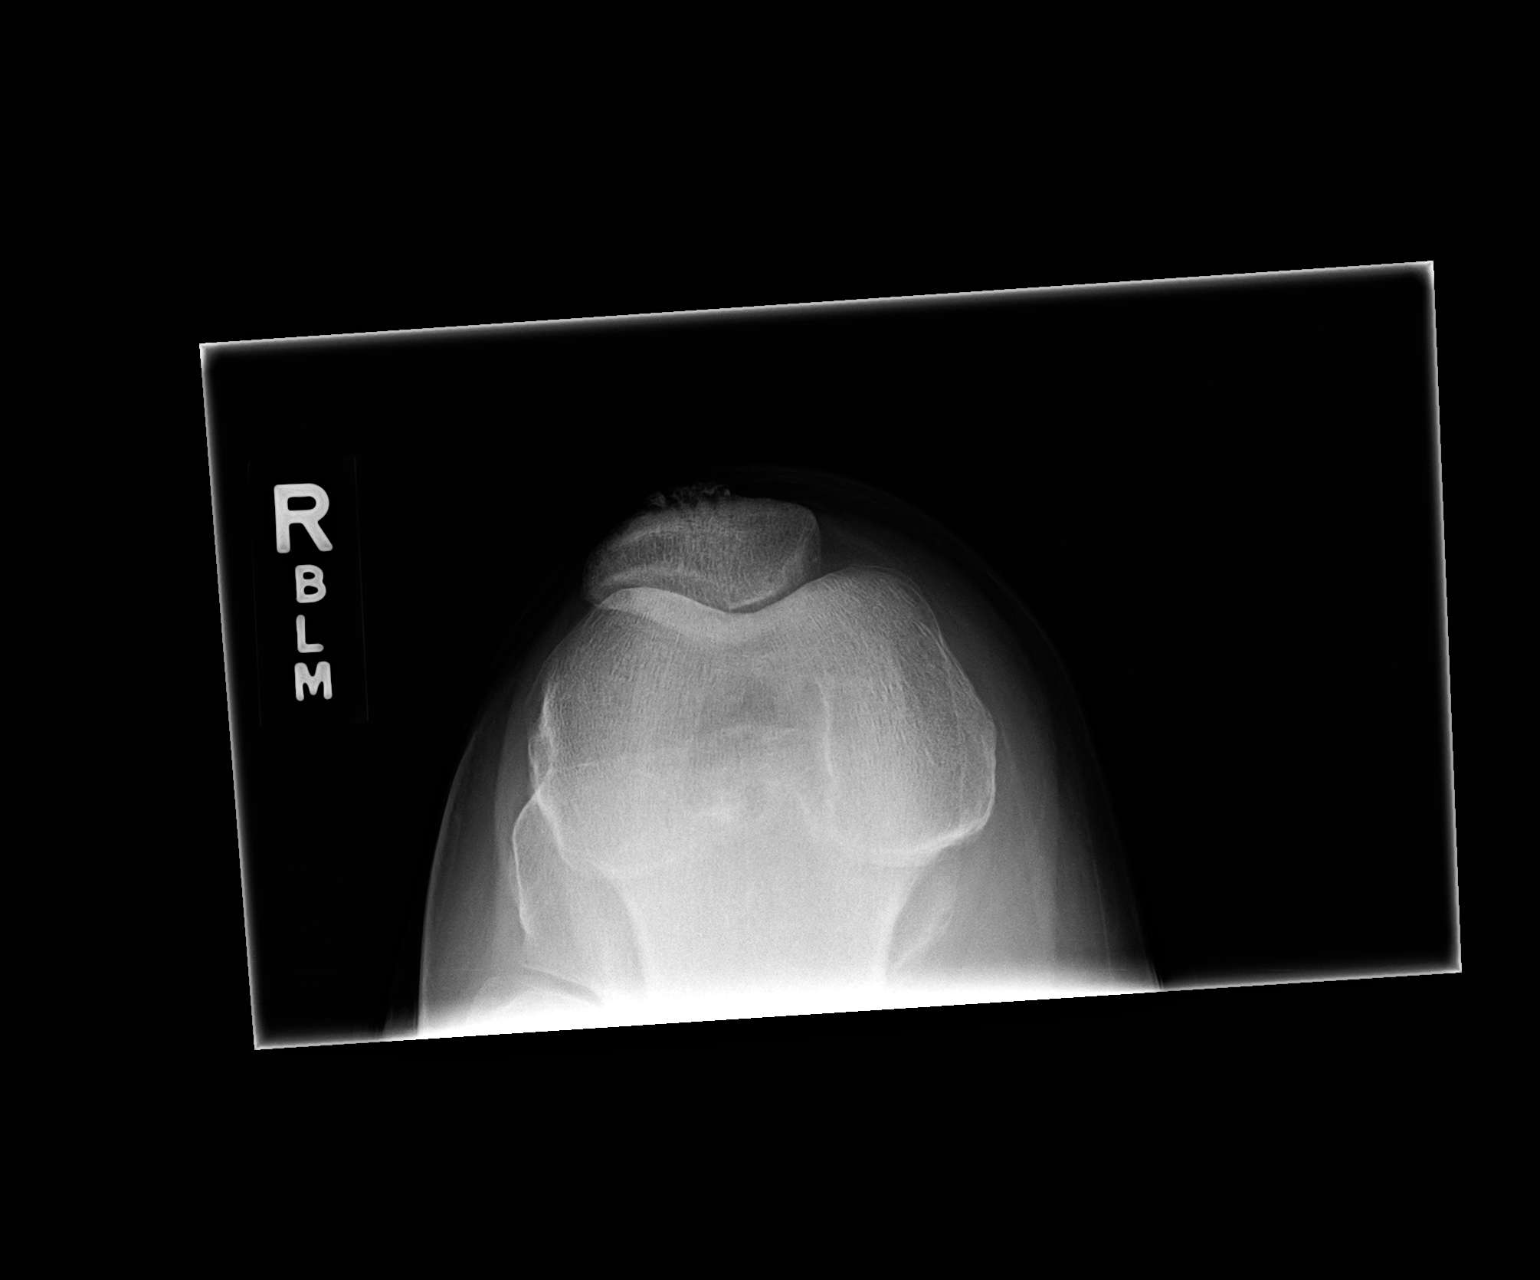

[2 of 2 positions shown; findings below may reference images not displayed]

EXAM

XR knee standing BI, XR right knee, XR left knee

INDICATION

knee pain
Patient c/o bilateral knee pain. Patient states she has fallen multiple times on her knees. Patient
also states her left knee has more pain and has
been hurting for about 5-6 years and the right knee has been having pain off and (..)

TECHNIQUE

Two views of the right knee. Two views of the left knee. AP weight-bearing views of both knees.

COMPARISONS

None available at the time of dictation.

FINDINGS

No radiographic evidence of an acute fracture, osseous malalignment, or aggressive focal osseous
lesion. No joint effusion. There is anatomic joint alignment. Small right quadriceps insertional
enthesophytes. Asymmetric decreased bone mineralization of the left knee when compared to the right,
which may be artifactual though correlate with clinical exam. Vascular calcifications.

On sunrise view, minimal marginal spurring in the patellofemoral compartment involving the medial
trochlear ridge of the left knee.

No radiographic evidence of significant joint space loss.

IMPRESSION
1. No radiographic evidence of an acute osseous abnormality or significant degenerative change.
2. Nonspecific asymmetric minimally decreased bone mineralization of the left knee when compared to
the right, which may be artifactual.

Tech Notes:

Patient c/o bilateral knee pain. Patient states she has fallen multiple times on her knees. Patient
also states her left knee has more pain and has been hurting for about 5-6 years and the right knee
has been having pain off and on for about 6 months. BM/TB

## 2020-03-02 IMAGING — MR Knee^Routine
6 of 7 series · 39 of 40 positions shown · non-contrast
Comparison: none

[Series 4: T2 fat-sat · axial · 4.0mm · 0.50mm/px · z∈[-129,-2]mm · 6 of 30 slices shown (1 of 3)]
[im 1/30]
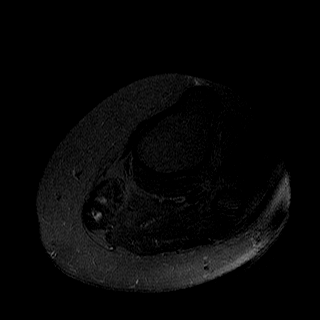
[im 6/30]
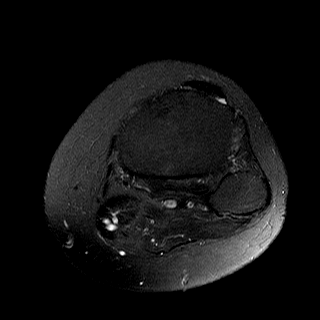
[im 12/30]
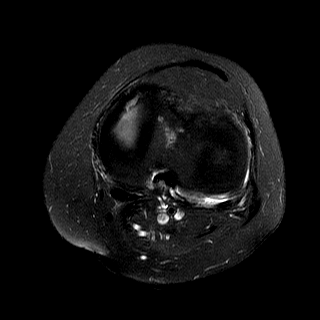
[im 18/30]
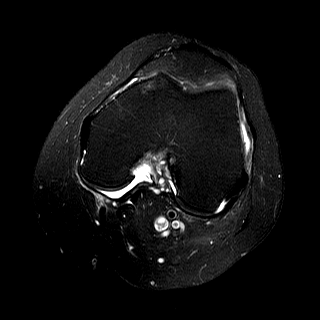
[im 24/30]
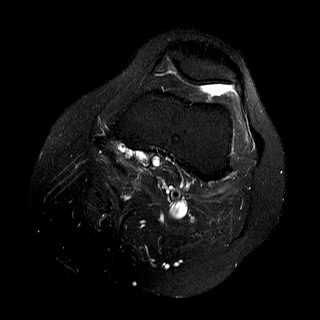
[im 30/30]
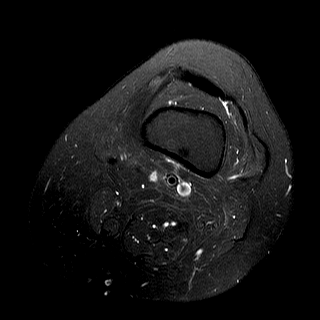

[Series 5: T1 · axial · 4.0mm · 0.62mm/px · z∈[-129,-2]mm · 7 of 30 slices shown (1 of 2)]
[im 1/30]
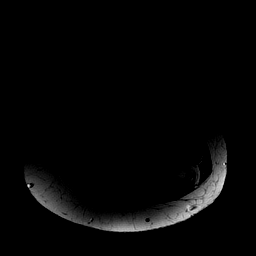
[im 5/30]
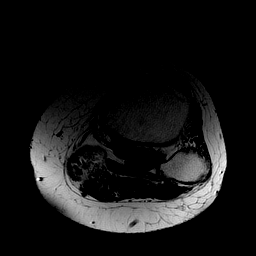
[im 10/30]
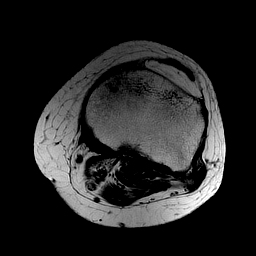
[im 15/30]
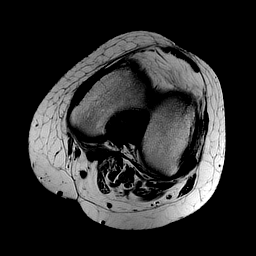
[im 20/30]
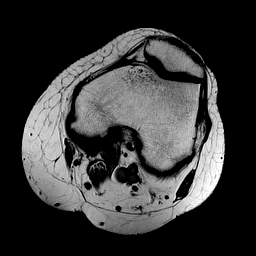
[im 25/30]
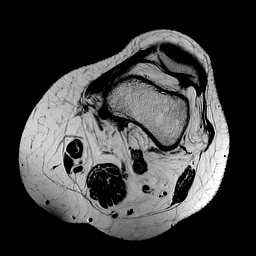
[im 30/30]
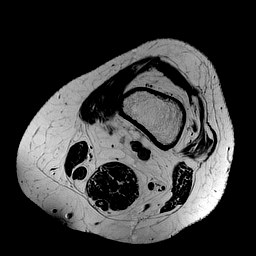

[Series 6: T1 · coronal · 4.0mm · 0.62mm/px · 6 of 25 slices shown (2 of 2)]
[im 1/25]
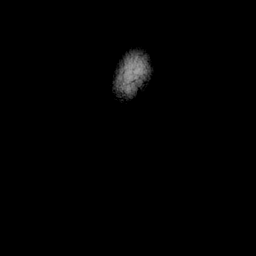
[im 5/25]
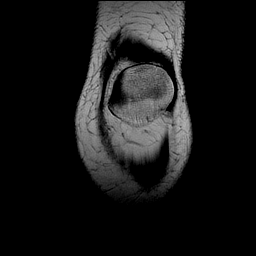
[im 10/25]
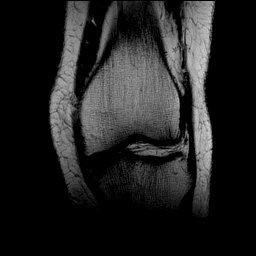
[im 15/25]
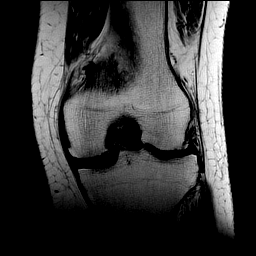
[im 20/25]
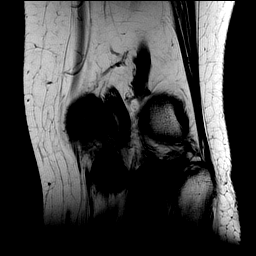
[im 25/25]
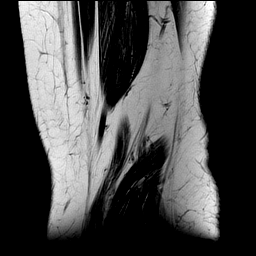

[Series 7: T2 fat-sat · coronal · 3.5mm · 0.50mm/px · 6 of 24 slices shown (2 of 3)]
[im 1/24]
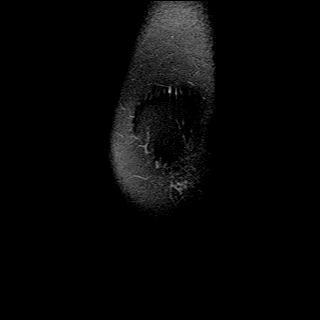
[im 5/24]
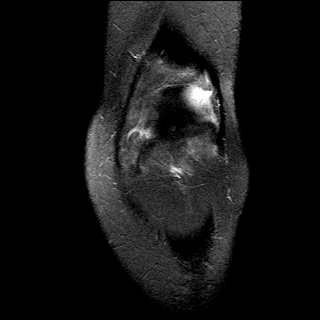
[im 10/24]
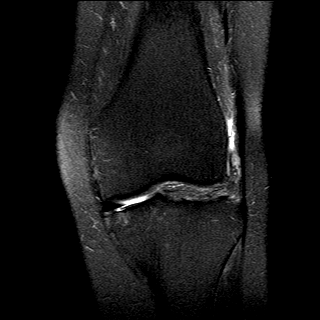
[im 14/24]
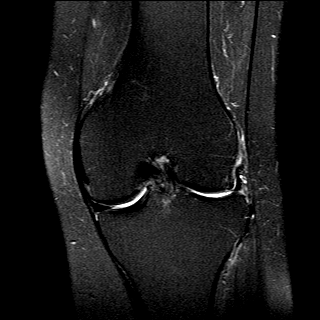
[im 19/24]
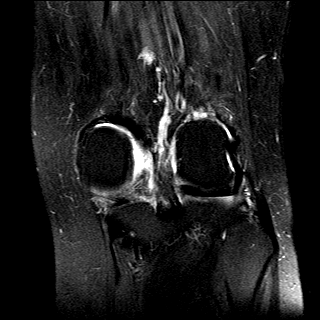
[im 24/24]
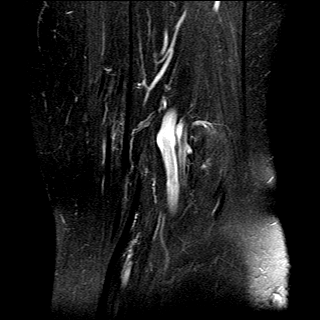

[Series 8: T2 fat-sat · sagittal · 4.0mm · 0.50mm/px · 7 of 28 slices shown (3 of 3)]
[im 1/28]
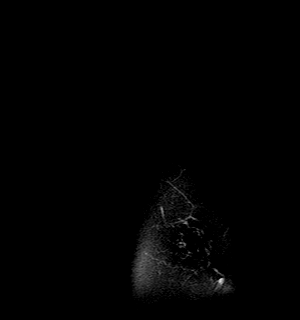
[im 5/28]
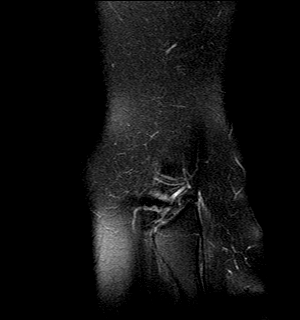
[im 10/28]
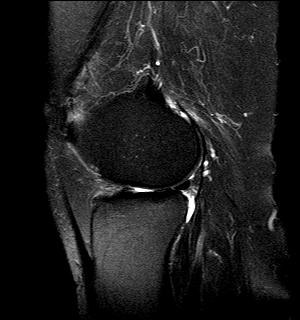
[im 14/28]
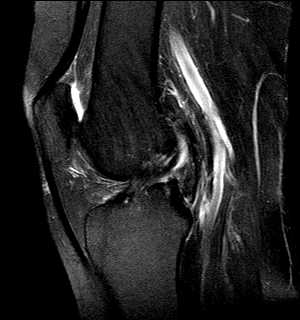
[im 19/28]
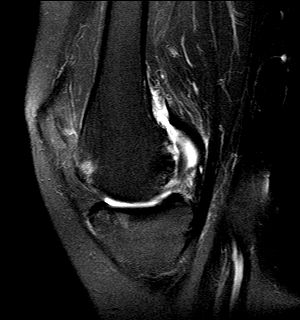
[im 23/28]
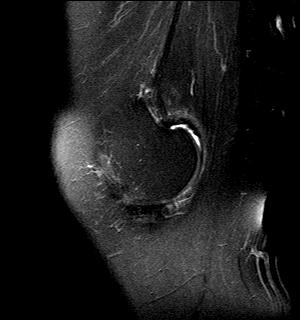
[im 28/28]
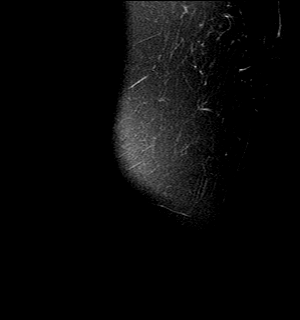

[Series 9: PD · sagittal · 4.0mm · 0.29mm/px · 7 of 28 slices shown]
[im 1/28]
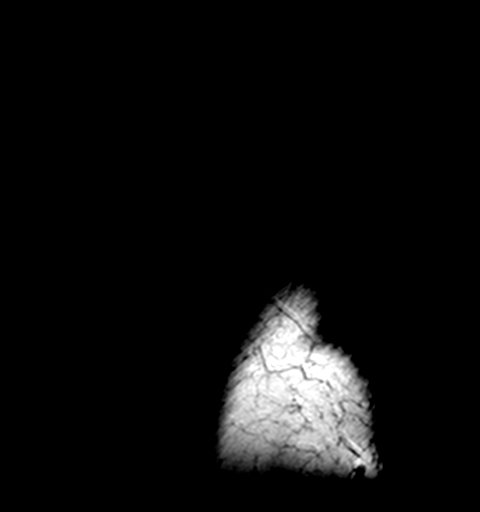
[im 5/28]
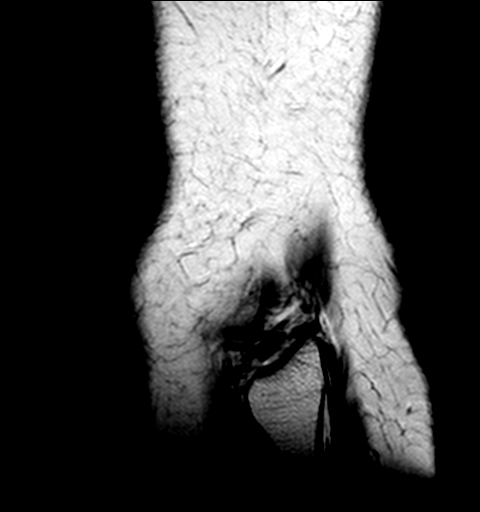
[im 10/28]
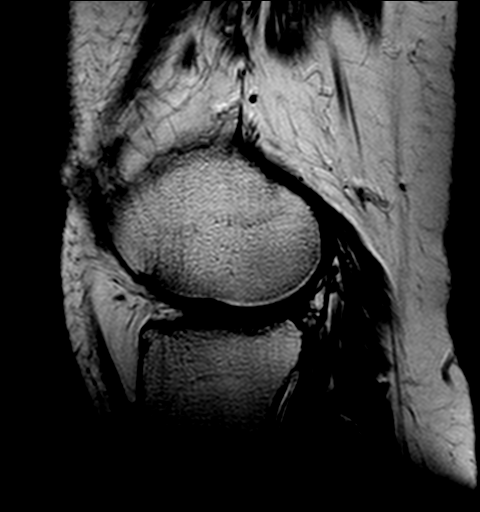
[im 14/28]
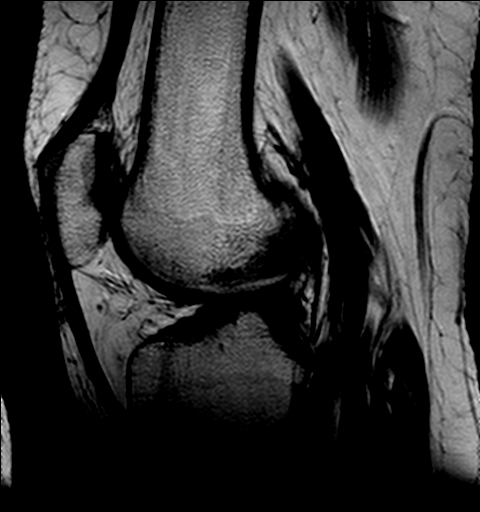
[im 19/28]
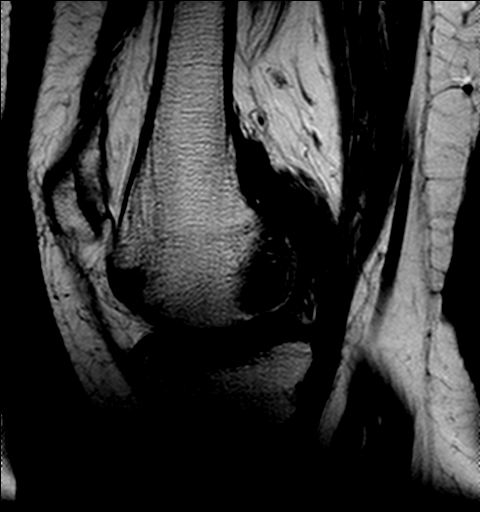
[im 23/28]
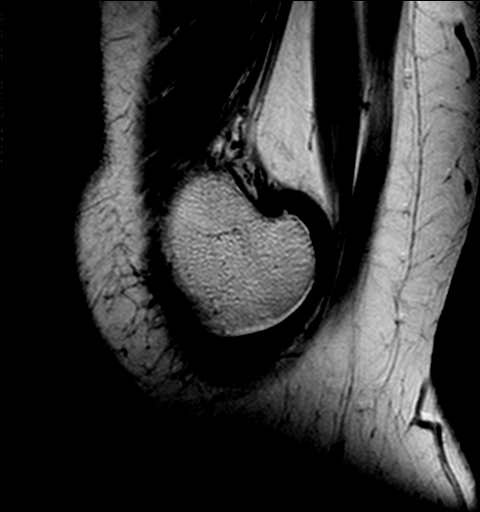
[im 28/28]
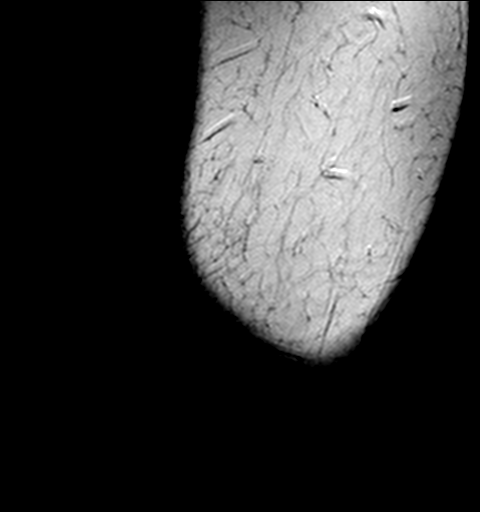

[39 of 40 positions shown; findings below may reference images not displayed]

DIAGNOSTIC STUDIES

EXAM

Left knee MRI.

INDICATION

knee pain
left knee pain, hx stroke effecting left leg/arm, no known injury

TECHNIQUE

Noncontrast left knee MRI protocol.

COMPARISONS

November 16, 2019

FINDINGS

Menisci: No compelling evidence of medial meniscus tearing, though signal may contacts the tibial
articular surface at the anterior horn on single image (series 8, image 9). There is marked
truncation of the lateral meniscus body with nearly absent anterior horn of the lateral meniscus.
Meniscal flap likely extends into the superior gutter.

Marrow/joint space: The articular cartilage of the knee is mostly preserved in the lateral
compartment, though there is mild thinning of the anterior lateral femoral condyle weight-bearing
surface. There is also evidence of a cartilage fissure at the lateral joint margin of the lateral
femoral condyle posterior weight-bearing surface with mild subchondral cystic change also present.
There is a small joint effusion. There is subchondral cystic change and fissuring of the medial
femoral trochlea at the joint margin with a grade 2-3 cartilage fissure also noted near the
trochlear apex. There is moderate thinning of the inferior lateral patellar facet cartilage. The
medial compartment articular cartilage shows thinning with subchondral cystic change at the anterior
medial tibial plateau.

Ligaments and tendons: The anterior cruciate ligament, posterior cruciate ligament, medial
collateral ligament, and lateral collateral ligament complex are intact. The patellar and quadriceps
tendon show no disruption. The popliteus tendon is unremarkable.

IMPRESSION

Markedly abnormal signal in the lateral meniscus anterior horn which is suggestive of degeneration
with truncation of the lateral meniscus body suggesting radial tear. There is also suspicion for a
meniscal flap extending superiorly into the posterior gutter. Question small horizontal tear of the
anterior horn of the medial meniscus seen only on 1 image and the os equivocal. There is relatively
mild chondrosis, though areas of subchondral cystic change and fissuring are present.

Tech Notes:

left knee pain, hx stroke effecting left leg/arm, no known injury

## 2020-03-26 ENCOUNTER — Encounter: Admit: 2020-03-26 | Discharge: 2020-03-26 | Payer: MEDICARE

## 2020-04-11 NOTE — Progress Notes
There is no height or weight on file to calculate BMI.             Assessment and Plan:  Dry Eye Syndrome of Both Eyes   Age-Related Nuclear Cataract of Both Eyes   Refraction Error   ?  08/29/19 visit  This nice lady with history of CVA 27 years ago and LASIK today demonstrates normal visual fields in both eyes, bilateral dry eye, and modest cataracts that are not visually significant.  We've given her updated glasses prescription with which she sees 20/20 OU, and suggested use of artificial tears prn.  She can f/up with her usual optometrist/ophthalmologist.    04/12/20  Profound dry eye OU with Schirmer w/o anesthesia =2/4 mm.  Since ATs alone didn't help, we'll add Restasis OU BID, and f/up with optometry in 3 months for recheck.

## 2020-04-12 ENCOUNTER — Encounter: Admit: 2020-04-12 | Discharge: 2020-04-12 | Payer: MEDICARE

## 2020-04-12 ENCOUNTER — Ambulatory Visit: Admit: 2020-04-12 | Discharge: 2020-04-12 | Payer: MEDICARE

## 2020-04-12 DIAGNOSIS — I639 Cerebral infarction, unspecified: Secondary | ICD-10-CM

## 2020-04-12 DIAGNOSIS — I10 Essential (primary) hypertension: Secondary | ICD-10-CM

## 2020-04-12 DIAGNOSIS — H04123 Dry eye syndrome of bilateral lacrimal glands: Secondary | ICD-10-CM

## 2020-04-12 MED ORDER — RESTASIS 0.05 % OP DPET
1 [drp] | Freq: Two times a day (BID) | OPHTHALMIC | 4 refills | 90.00000 days | Status: AC
Start: 2020-04-12 — End: ?

## 2020-04-18 ENCOUNTER — Encounter: Admit: 2020-04-18 | Discharge: 2020-04-18 | Payer: MEDICARE

## 2020-04-18 NOTE — Telephone Encounter
Patient called clinic Tuesday 4:06PM indicating that her Restasis rx prescribed on 11/04/[facility] consult, has not been received by her pharmacy. Patient states that it was to be sent in to [facility].  Left message indicating the information of pharmacy on her O2 file and to call us back to reconfirm where she wishes the rx to be resent to.  Please assist patient with this prescription refill accordingly when she calls back in or seek assistance from [facility] team.

## 2020-04-19 ENCOUNTER — Encounter: Admit: 2020-04-19 | Discharge: 2020-04-19 | Payer: MEDICARE

## 2020-04-19 MED ORDER — RESTASIS 0.05 % OP DPET
1 [drp] | Freq: Two times a day (BID) | OPHTHALMIC | 4 refills | 90.00000 days | Status: AC
Start: 2020-04-19 — End: ?

## 2020-04-25 ENCOUNTER — Encounter: Admit: 2020-04-25 | Discharge: 2020-04-25 | Payer: MEDICARE

## 2020-04-25 NOTE — Telephone Encounter
PE spoke with the patient. We sent in the prescription for [facility] to [facility] on [facility]. She was already of aware of that but she cannot afford the restasis. I offered for her to call her pharmacy and see what savings card the would accept for savings like the good rx card or something similar. She can also go to [facility] and and apply for savings on their website as well.

## 2020-08-15 ENCOUNTER — Encounter: Admit: 2020-08-15 | Discharge: 2020-08-15 | Payer: MEDICARE

## 2020-08-15 ENCOUNTER — Ambulatory Visit: Admit: 2020-08-15 | Discharge: 2020-08-16 | Payer: MEDICARE

## 2020-08-15 DIAGNOSIS — H04123 Dry eye syndrome of bilateral lacrimal glands: Secondary | ICD-10-CM

## 2020-08-15 DIAGNOSIS — I10 Essential (primary) hypertension: Secondary | ICD-10-CM

## 2020-08-15 DIAGNOSIS — H2513 Age-related nuclear cataract, bilateral: Secondary | ICD-10-CM

## 2020-08-15 DIAGNOSIS — I639 Cerebral infarction, unspecified: Secondary | ICD-10-CM

## 2020-08-15 NOTE — Progress Notes
There is no height or weight on file to calculate BMI.             Assessment and Plan:  1.  Dry eye  2.  Cataracts    Patient would like to be rid of her glasses, and is interested in pursuing cataract surgery for that option, particularly if Medicare will cover the cost.

## 2020-09-17 IMAGING — CR HIPCMLT
2 series · 2 of 2 positions shown · non-contrast
Comparison: none

[hip ap pelvis]
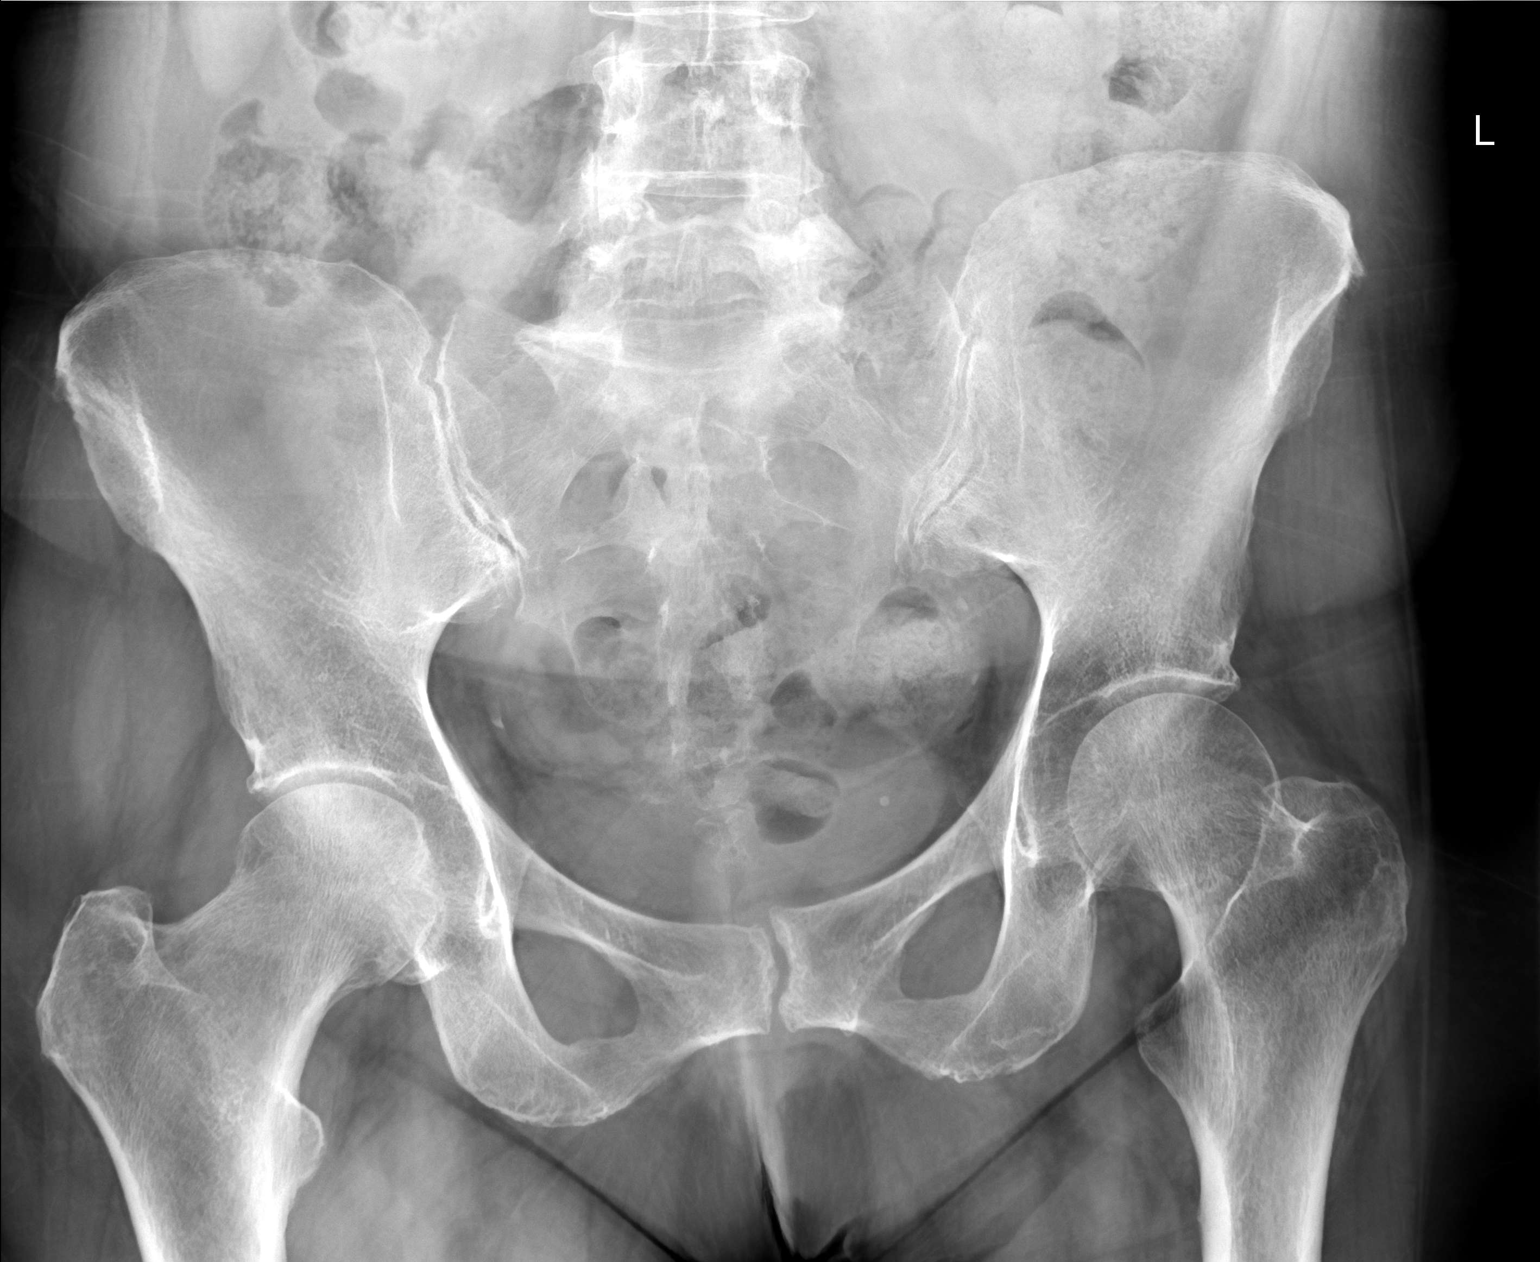

[hip frog lat]
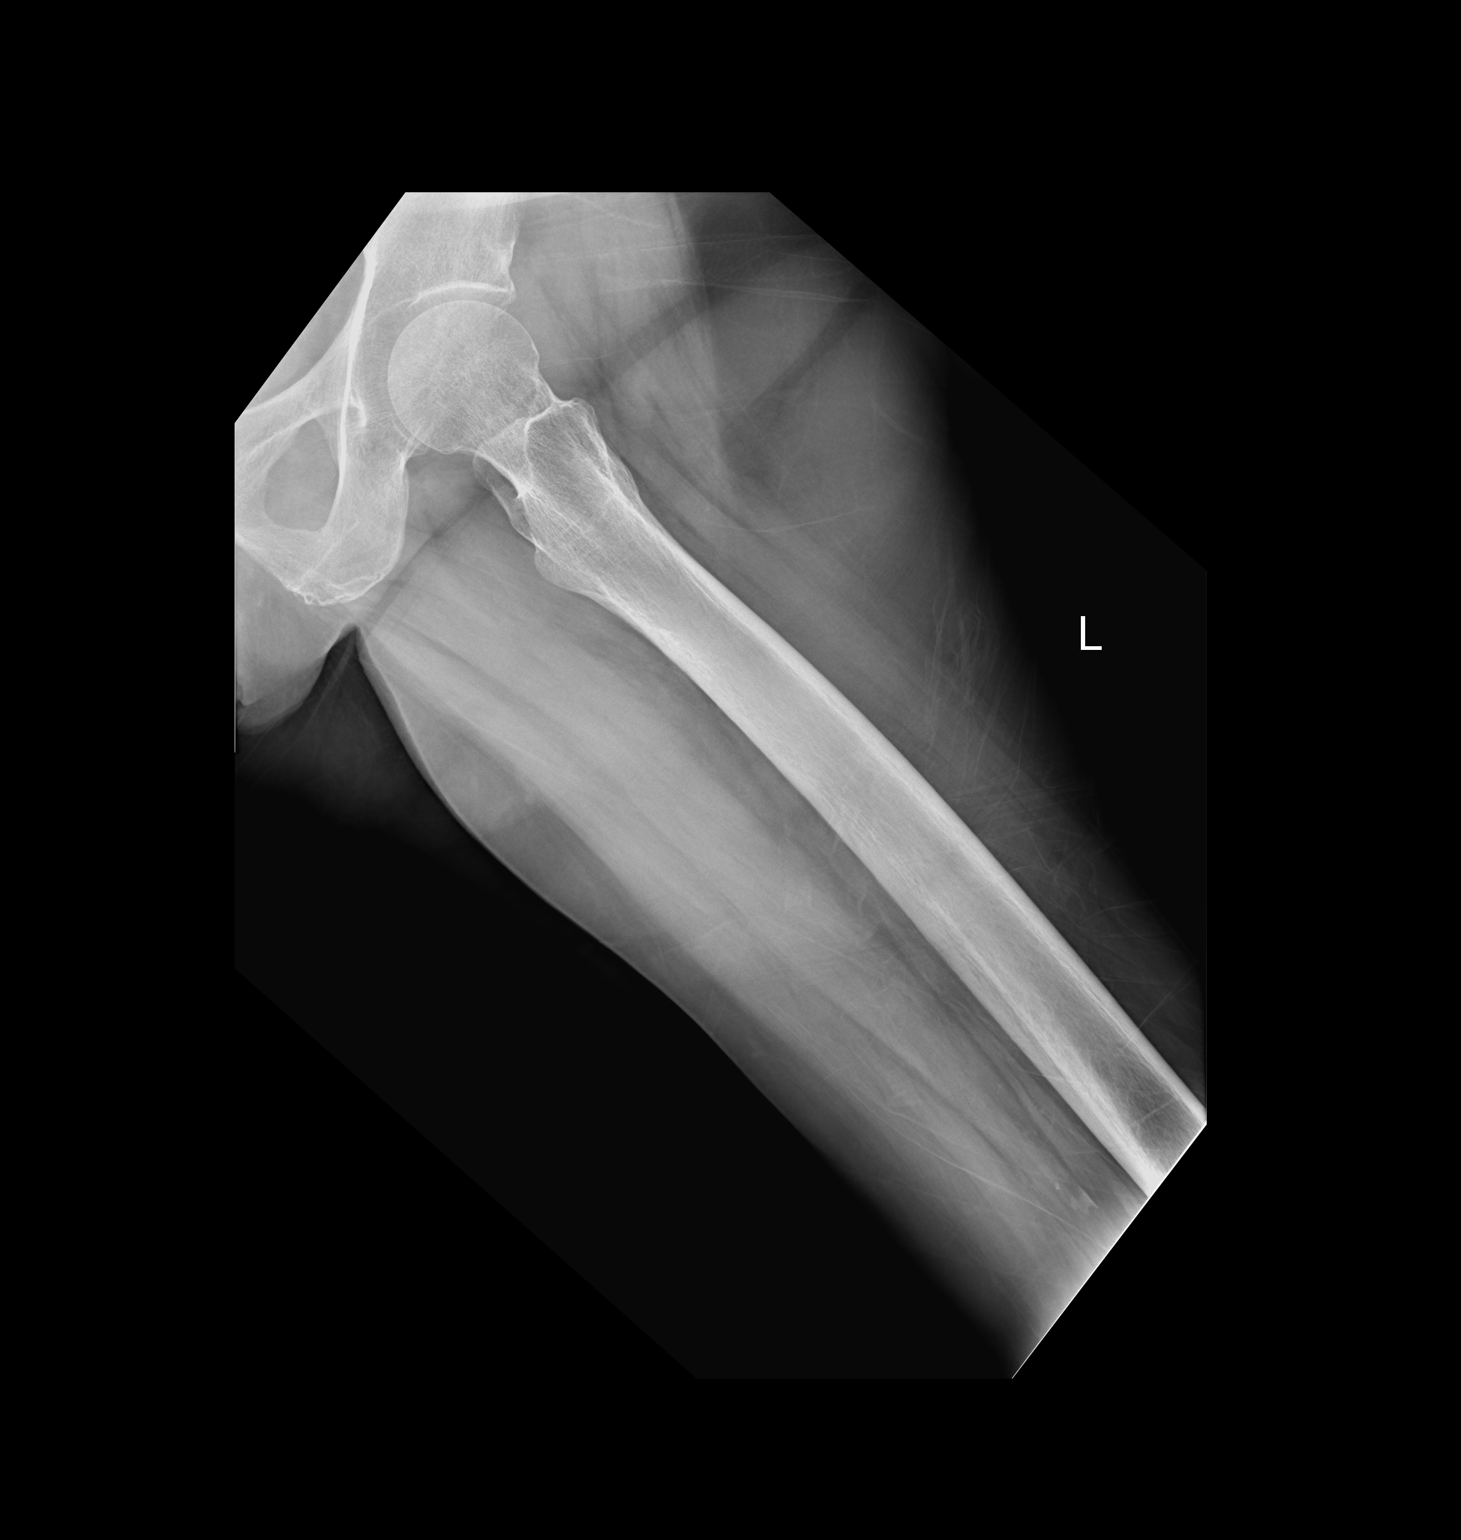

[2 of 2 positions shown; findings below may reference images not displayed]

DIAGNOSTIC STUDIES

EXAM

XR hip LT, 2-3V w or wo pelvis

INDICATION

worsening left hpi pain
lt hip pain, bursitis for the last 2 months, no injury, surgery, or injection hx. HS/KF

TECHNIQUE

AP lateral views left hip

COMPARISONS

None available

FINDINGS

Minimal loss of articular cartilage is seen. No fractures are evident. Mild degenerative changes of
the sacroiliac joints are noted.

IMPRESSION

Mild loss of articular cartilage. No fractures are seen.

Tech Notes:

lt hip pain, bursitis for the last 2 months, no injury, surgery, or injection hx. HS/KF

## 2020-10-27 NOTE — Progress Notes
[facility]    Encounter Date: 10/31/2020    Subjective:    Jasmine Blackwell is a 65 y.o. female .    Vision Change (Pt sts vision has decreased. Pt denies having glares and halos. Pt sts vision is blurred, looking through a foggy window. Pt sts sometimes will have stabs of pain behind the eye. ), Dry Eye (Pt c/o watering, itching, redness and irritation. Pt sts been consistent. ), Spots and/or Floaters (Pt denies flashes and floaters. ), FYI ([facility] had Lasik 2013 (monovision)), and Medications Only (TheraTears BID-TID OU)    HPI   PLANO PRESBYOPE  HISTORY OF STROKE  WOULD LIKE TO REDUCE NEED FOR SPECTACLES  HISTORY OF LASIK IN 2012    This Visit:     I-Design 2.0 Wavescan testing (2nd floor)    Corneal topography Atlas    Corneal topography Galilei    IOL Master with Barrett formula ([id number], CZ70BD, Symphony, Panoptic) xx   OCT (anterior segment, cornea, macula, optic nerve) (Cirrus, [place])        Snellen vision xx   Ocular dominance determination    Manifest refraction xx   Cycloplegic refraction    Brightness acuity test (BAT) xx   Pupillary response for relative afferent pupillary defect (RAPD) xx   Ishihara Pseudoisochromatic plates    IOP using TonoPen as standard or Pneumotonometer in Kpro patients  xx   Pachymetry    Lissamine green or fluorescein stain of ocular surface    Schirmer test with anesthesia    Dilate xx       A-Scan with IOL calculation    B-Scan    UBM - immersion 50 Mhz         Optos wide field photography    Humphrey visual field 24-2    Specular microscopy    Confocal microscopy         Order ProKera amniotic membrane    Order amphotericin b and/or vancomycin (O'Briens)    Kontur contact lens    Betadyne rinse of fornix      Medical History:   Diagnosis Date   ? Hypertension    ? Stroke [facility]) 1993     Surgical History:   Procedure Laterality Date   ? LASIK Bilateral 2012   ? CESAREAN SECTION       Family History   Problem Relation Age of Onset   ? Cataract Neg Hx    ? Glaucoma Neg Hx    ? Macular Degen Neg Hx    ? Retinal Detachment Neg Hx    ? Strabismus Neg Hx    ? Blindness Neg Hx      Social History     Socioeconomic History   ? Marital status: Married   Tobacco Use   ? Smoking status: Never Smoker   ? Smokeless tobacco: Never Used   Substance and Sexual Activity   ? Alcohol use: Yes         Current Outpatient Medications:   ?  aspirin EC 325 mg tablet, 325 mg, 1 Tab, daily PO, 0, 0, Disp: , Rfl:   ?  atorvastatin (LIPITOR) 10 mg tablet, Take 10 mg by mouth., Disp: , Rfl:   ?  benazepriL (LOTENSIN) 10 mg tablet, benazepril 10 mg tablet, Disp: , Rfl:   ?  cholecalciferol (VITAMIN D-3) 1,000 units tablet, Daily, Disp: , Rfl:   ?  cycloSPORINE (RESTASIS) 0.05 % ophthalmic emulsion, Apply one drop to both eyes twice daily., Disp: 60  each, Rfl: 4  ?  diphenhydrAMINE (BENADRYL) 25 mg capsule, As Needed At Bedtime, Disp: , Rfl:   ?  docusate (COLACE) 100 mg capsule, 1 Cap, BID, PO, 30 Cap, 1 Number of Refills, 1, Print Requisition, Disp: , Rfl:   ?  fluticasone propionate (FLONASE) 50 mcg/actuation nasal spray, suspension, fluticasone propionate 50 mcg/actuation nasal spray,suspension, Disp: , Rfl:   ?  magnesium oxide (MAG-OX) 400 mg (241.3 mg magnesium) tablet, , Disp: , Rfl:   ?  multivit with min-folic acid (ADULT MULTIVITAMIN GUMMIES) 200 mcg chewable, , Disp: , Rfl:   ?  Potassium 99 mg tab, daily., Disp: , Rfl:   ?  potassium chloride 20 mEq/15 mL oral solution, , Disp: , Rfl:   ?  traZODone (DESYREL) 50 mg tablet, Take 50 mg by mouth at bedtime daily., Disp: , Rfl:     has No Known Allergies.    Review of Systems    Objective     Base Eye Exam     Visual Acuity (Snellen - Linear)       Right Left    Dist cc 20/30 -2 20/20    Dist ph cc 20/20 -1    Correction: Glasses           Tonometry (Tonopen, 2:38 PM)       Right Left    Pressure 18 9   Pt was squeezing OD           Pupils       Dark Shape React APD    Right 3 Round 4 None    Left 3 Round 4 None          Neuro/Psych Oriented x3: Yes  Mood/Affect: Normal           Dilation     Both eyes: 1.0% Tropicamide, 2.5% Phenylephrine @ 2:38 PM            Additional Tests     Glare Testing       High    Right 20/20    Left 20/25+2            Slit Lamp and Fundus Exam     External Exam       Right Left    External Normal Normal          Slit Lamp Exam       Right Left    Lids/Lashes Normal Normal    Conjunctiva/Sclera White and quiet White and quiet    Cornea Clear Clear    Anterior Chamber Deep and quiet Deep and quiet    Iris Flat Flat    Lens trace NS trace NS    Vitreous Normal Normal          Fundus Exam       Right Left    Disc Sharp, healthy rim Sharp, healthy rim    C/D Ratio 0.2 0.2    Macula Flat Flat    Vessels Normal caliber and number Normal caliber and number    Periphery Attached, no breaks or tears Attached, no breaks or tears            Refraction     Wearing Rx    Age: 50 year  Type: Bifocal - Progressive           Manifest Refraction       Sphere Cylinder Axis Dist VA    Right +1.25 +0.50 155 20/20-1    Left [place] +0.25 020  20/20                      EXAMINATION DIAGNOSIS:  1. Age-related nuclear cataract of both eyes     2. Dry eye syndrome of both eyes     3. Cerebrovascular accident (CVA), unspecified mechanism (HCC)     4. 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)        RESIDENT/FELLOW COMMENTS:  Assessment:  NA    Plan:  NA     FACULTY COMMENTS:  Assessment:  DUE TO STROKE, THERE IS AN ISSUE WITH SPECTACLE WEAR (LEFT HEMIPARESIS)  SHE WORKS AS A LIBRARIAN AND NEEDS DISTANCE/INTERMEDIATE VISION  SHE IS USED TO MONOVISION WITH THE LEFT EYE BEING THE NEAR EYE  SHE IS NOW HYPEROPIC/PLANO PRESBYOPE  I ASSUME THAT DR Melene Muller WAS THINKING THAT PERHAPS CLEAR LENSECTOMY WOULD SOLVE MANY OF HER VISION ISSUES USING EITHER MONOVISION VS MULTIFOCAL LENS IMPLANTS    Plan:  TO [facility] TO DETERMINE WHICH TREATMENT IS ADVISABLE:  -CLEAR LENSECTOMY WITH MONOVISION  -CLEAR LENSECTOMY WITH PANOPTIX MULTIFOCAL LENSES  -SPECTACLE WEAR WITH MONOVISION CORRECTION    Teaching Statement  I have interviewed and examined the patient and confirm the pertinent findings. I have discussed the case with the resident/fellow and agree with the findings and plan as documented.    (electronically signed)  Carron Brazen, MD  Professor, Clinical Ophthalmology  Cornea and External Diseases  [facility])  Department of Ophthalmology    Eye  [address]  [place], [place] [address]  [phone] (phone)  [email or web address] (email)

## 2020-10-31 ENCOUNTER — Encounter: Admit: 2020-10-31 | Discharge: 2020-10-31 | Payer: MEDICARE

## 2020-10-31 ENCOUNTER — Ambulatory Visit: Admit: 2020-10-31 | Discharge: 2020-11-01 | Payer: MEDICARE

## 2020-10-31 DIAGNOSIS — I639 Cerebral infarction, unspecified: Secondary | ICD-10-CM

## 2020-10-31 DIAGNOSIS — I10 Essential (primary) hypertension: Secondary | ICD-10-CM

## 2020-10-31 DIAGNOSIS — I69354 Hemiplegia and hemiparesis following cerebral infarction affecting left non-dominant side: Secondary | ICD-10-CM

## 2020-10-31 DIAGNOSIS — H04123 Dry eye syndrome of bilateral lacrimal glands: Secondary | ICD-10-CM

## 2020-10-31 NOTE — Patient Instructions
EXAMINATION DIAGNOSIS:  1. Age-related nuclear cataract of both eyes     2. Dry eye syndrome of both eyes     3. Cerebrovascular accident (CVA), unspecified mechanism (HCC)     4. 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)        RESIDENT/FELLOW COMMENTS:  Assessment:  NA    Plan:  NA     FACULTY COMMENTS:  Assessment:  DUE TO STROKE, THERE IS AN ISSUE WITH SPECTACLE WEAR (LEFT HEMIPARESIS)  SHE WORKS AS A LIBRARIAN AND NEEDS DISTANCE/INTERMEDIATE VISION  SHE IS USED TO MONOVISION WITH THE LEFT EYE BEING THE NEAR EYE  SHE IS NOW HYPEROPIC/PLANO PRESBYOPE  I ASSUME THAT DR Melene Muller WAS THINKING THAT PERHAPS CLEAR LENSECTOMY WOULD SOLVE MANY OF HER VISION ISSUES USING EITHER MONOVISION VS MULTIFOCAL LENS IMPLANTS    Plan:  TO [facility] TO DETERMINE WHICH TREATMENT IS ADVISABLE:  -CLEAR LENSECTOMY WITH MONOVISION  -CLEAR LENSECTOMY WITH PANOPTIX MULTIFOCAL LENSES  -SPECTACLE WEAR WITH MONOVISION CORRECTION

## 2020-11-01 DIAGNOSIS — H2513 Age-related nuclear cataract, bilateral: Secondary | ICD-10-CM

## 2020-12-24 ENCOUNTER — Encounter: Admit: 2020-12-24 | Discharge: 2020-12-24 | Payer: MEDICARE

## 2020-12-24 ENCOUNTER — Ambulatory Visit: Admit: 2020-12-24 | Discharge: 2020-12-24 | Payer: MEDICARE

## 2020-12-24 DIAGNOSIS — I10 Essential (primary) hypertension: Secondary | ICD-10-CM

## 2020-12-24 DIAGNOSIS — H5231 Anisometropia: Secondary | ICD-10-CM

## 2020-12-24 DIAGNOSIS — I639 Cerebral infarction, unspecified: Secondary | ICD-10-CM

## 2020-12-24 NOTE — Progress Notes
Body mass index is 24.37 kg/m.             Assessment and Plan:  A/ 1. Anisometropia    -Patient had LASIK and was targeted for monovision    -post surgery OD for distance/OS for near    -since surgery has had hyperopic shift OU.      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 Educated patient on findings and discussed refractive options in glasses and with clear lens exchange. Upon discussion patient's sensitivity to monovision as demonstrated in the office would likely limit options for correction. Monovision glasses would likely not be tolerated, suspect would have the same reaction to clear lens exchange. Would have concern with multifocal lens implants, due to patient's sensitivity to perceived blur/glare under normal circumstance. Feel her best option is to continue with PAL glasses with understanding that there is compromise, but still likely continues to be best option for patient at this time.

## 2021-02-18 IMAGING — CR [ID]
3 series · 3 of 3 positions shown · non-contrast
Comparison: none

[knee ap]
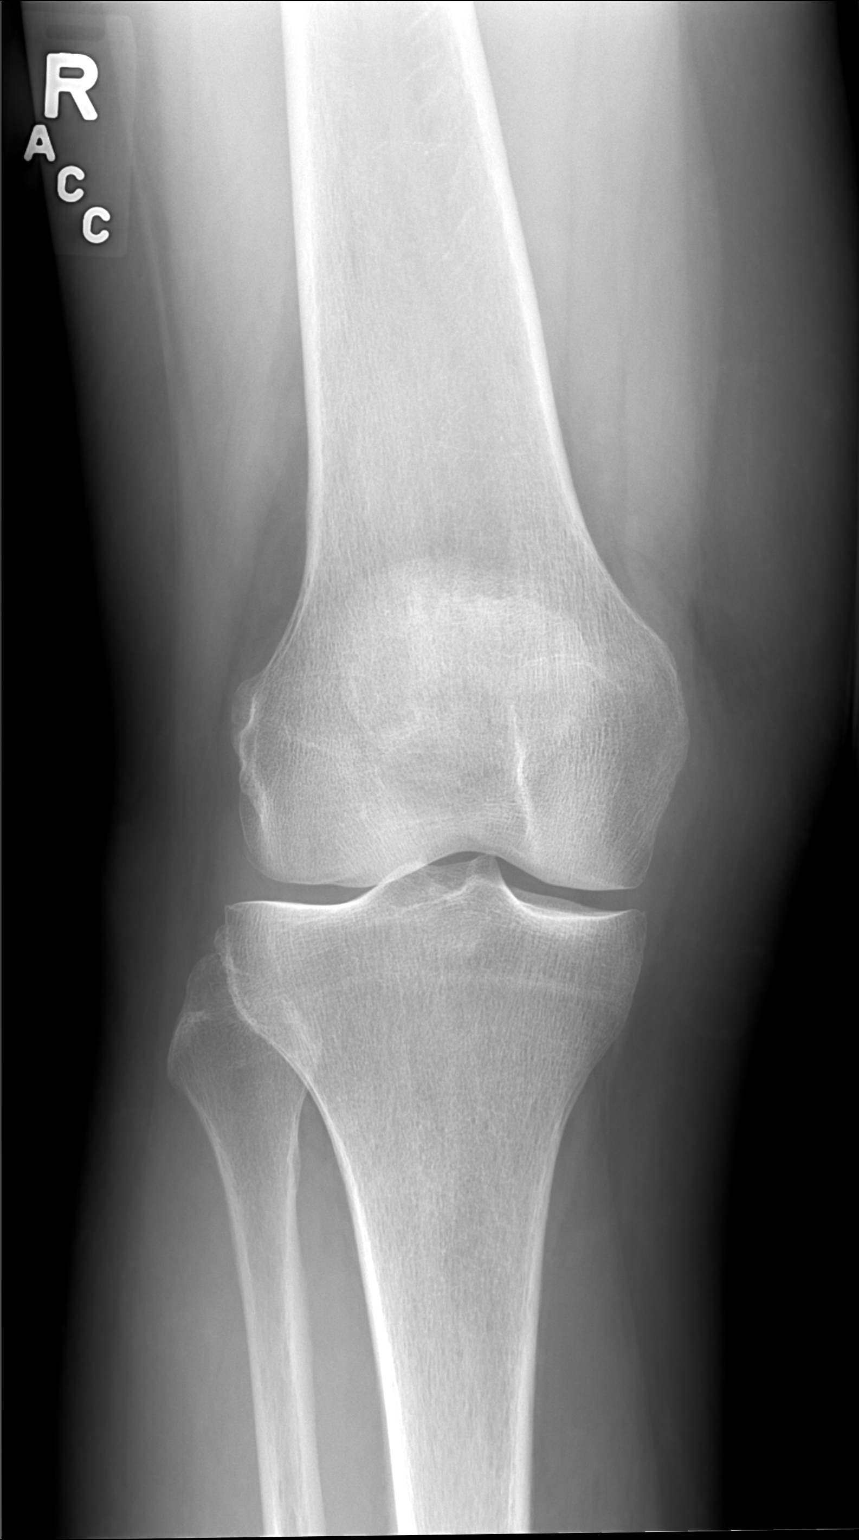

[knee sunrise]
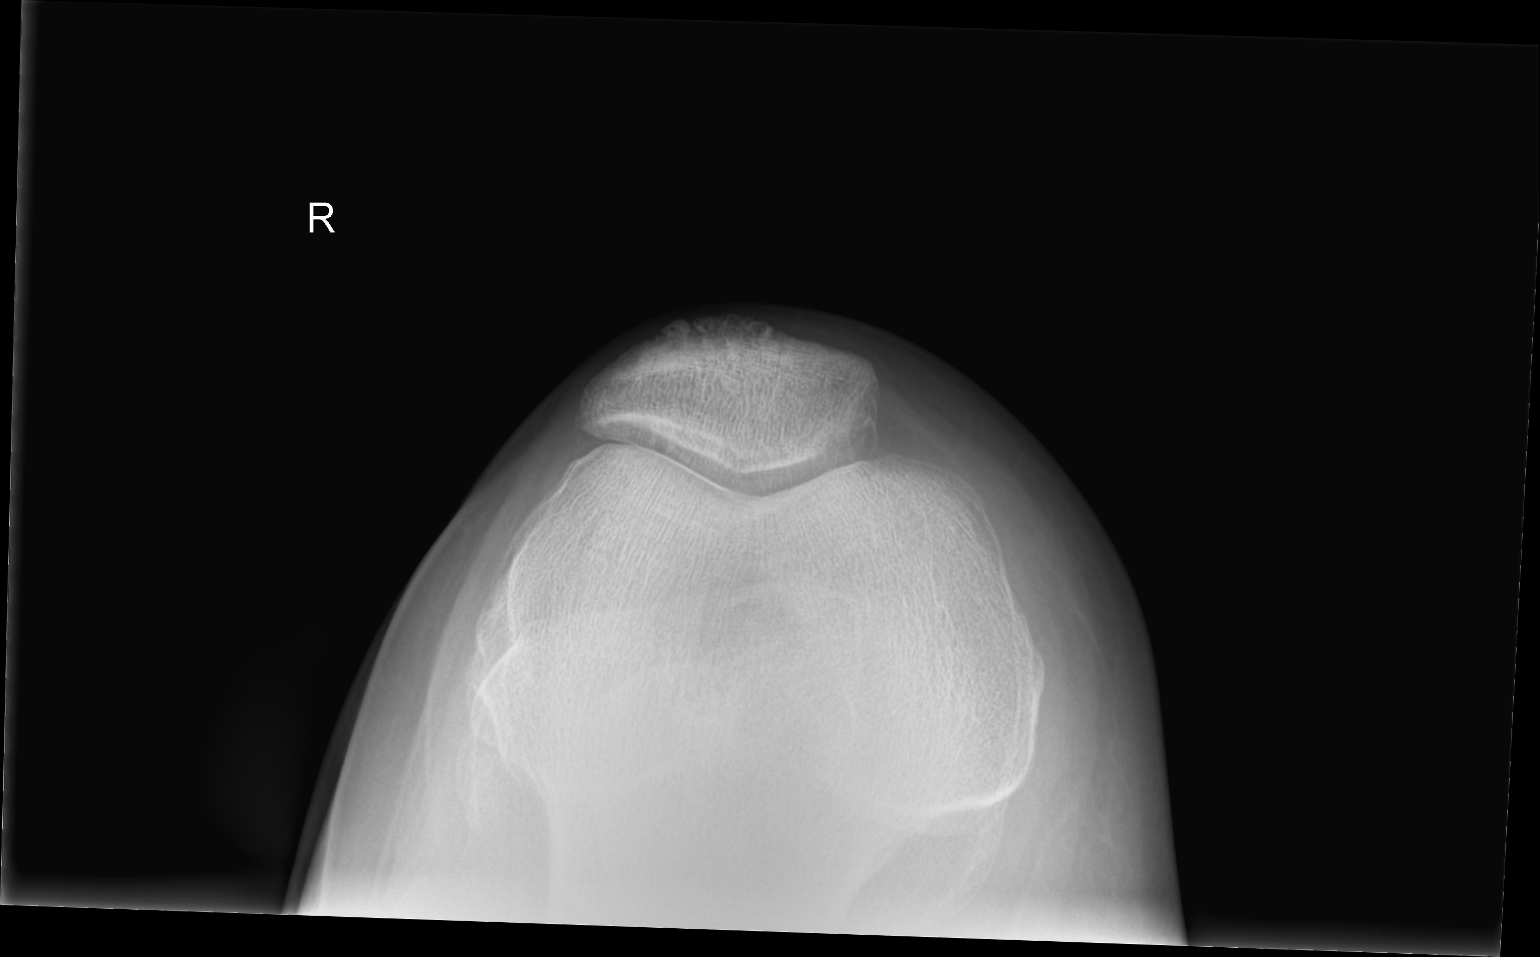

[knee lat]
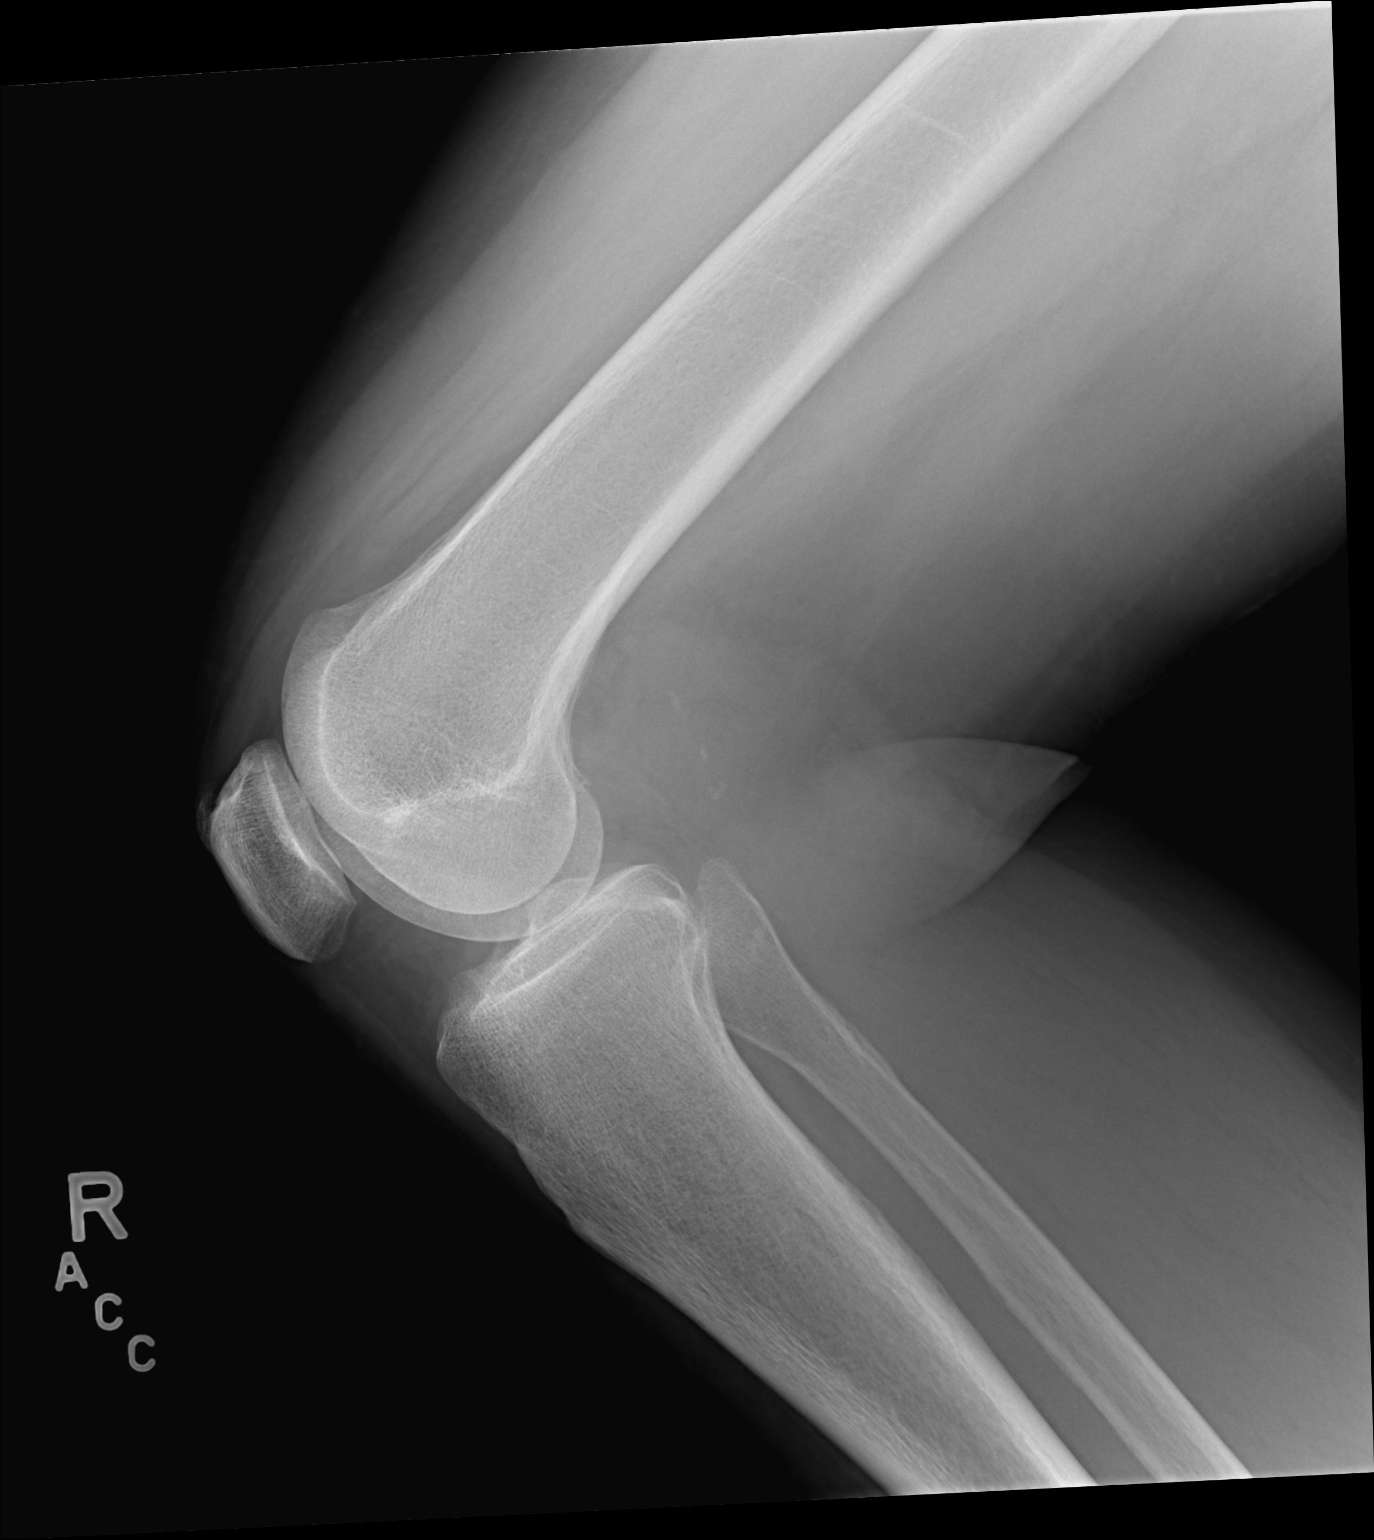

[3 of 3 positions shown; findings below may reference images not displayed]

EXAM

Right knee

INDICATION

Right Knee pain and effusion
Right knee pain, no  known injury.

FINDINGS

Three views of the right knee were obtained.

There is no fracture or dislocation. There is a very small joint effusion. There is a small
osteophyte along the anterior superior margin of the patella.

There are very small osteophytes of the medial and lateral tibial plateaus.

IMPRESSION

There is no fracture or acute bone abnormality of the right knee. There is a very small joint
effusion. There is very mild osteoarthrosis.

Tech Notes:

Right knee pain, no  known injury.

## 2021-04-10 ENCOUNTER — Encounter: Admit: 2021-04-10 | Discharge: 2021-04-10 | Payer: MEDICARE

## 2021-05-30 IMAGING — CR WRISTCMRT
3 series · 3 of 3 positions shown · non-contrast
Comparison: none

[x wrist pa right]
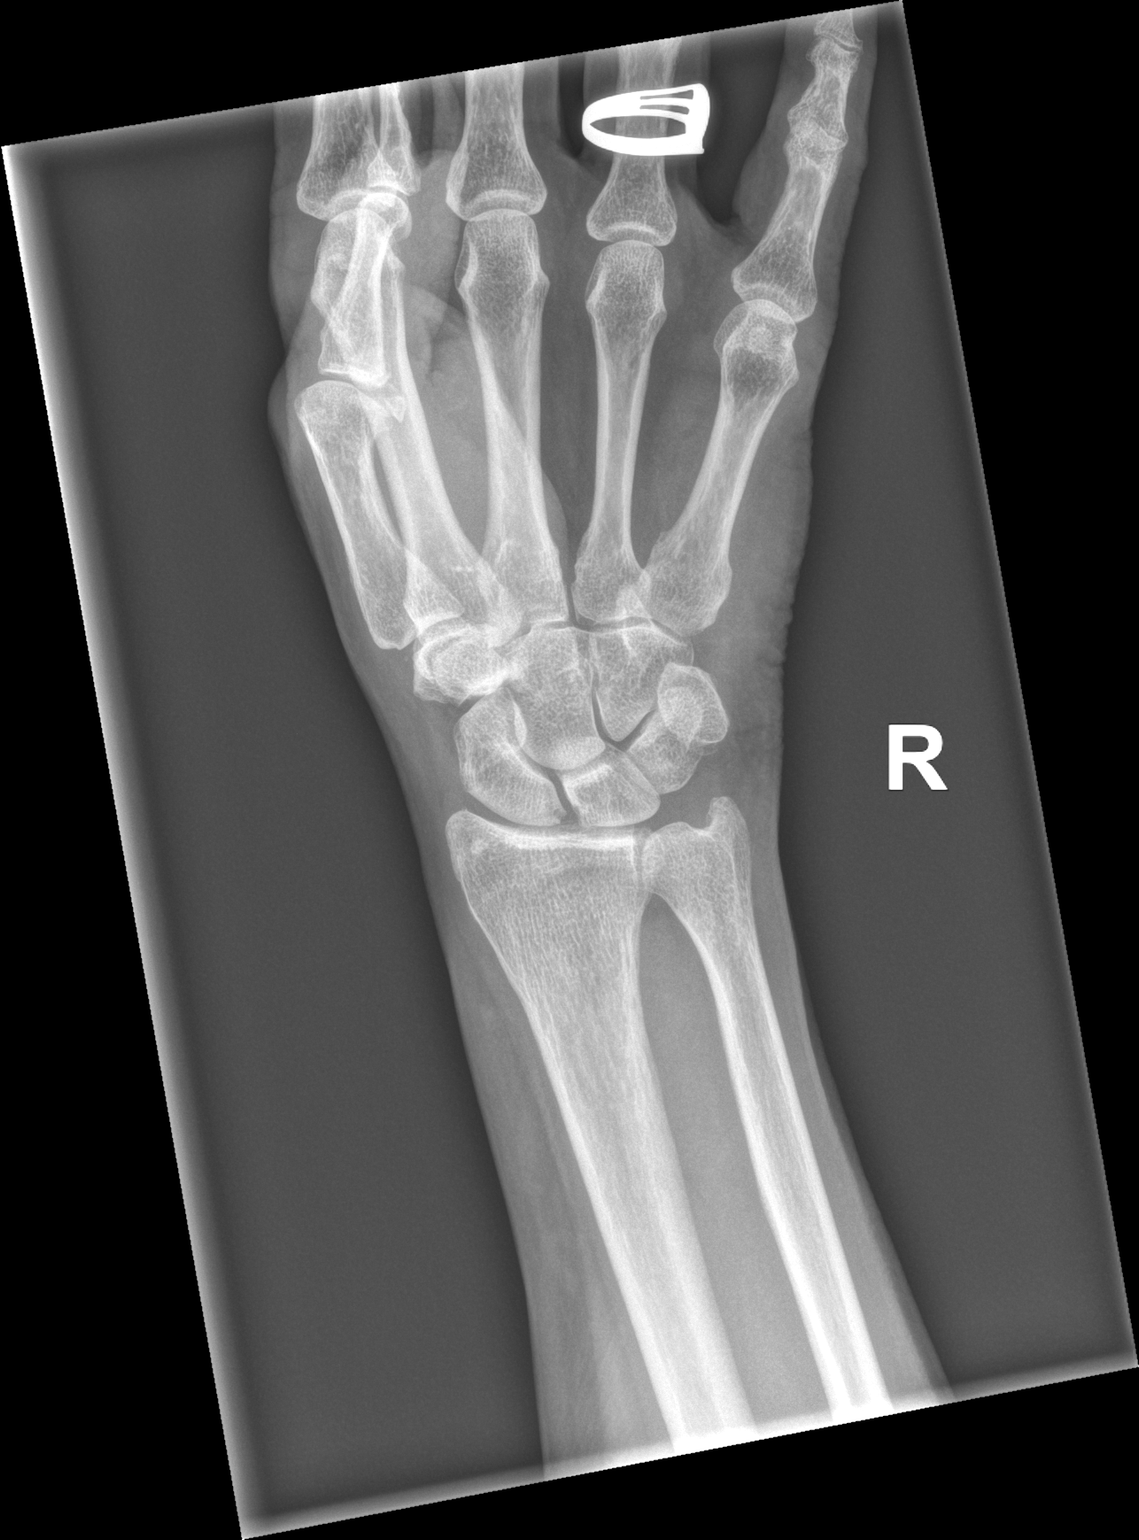

[x wrist obl right]
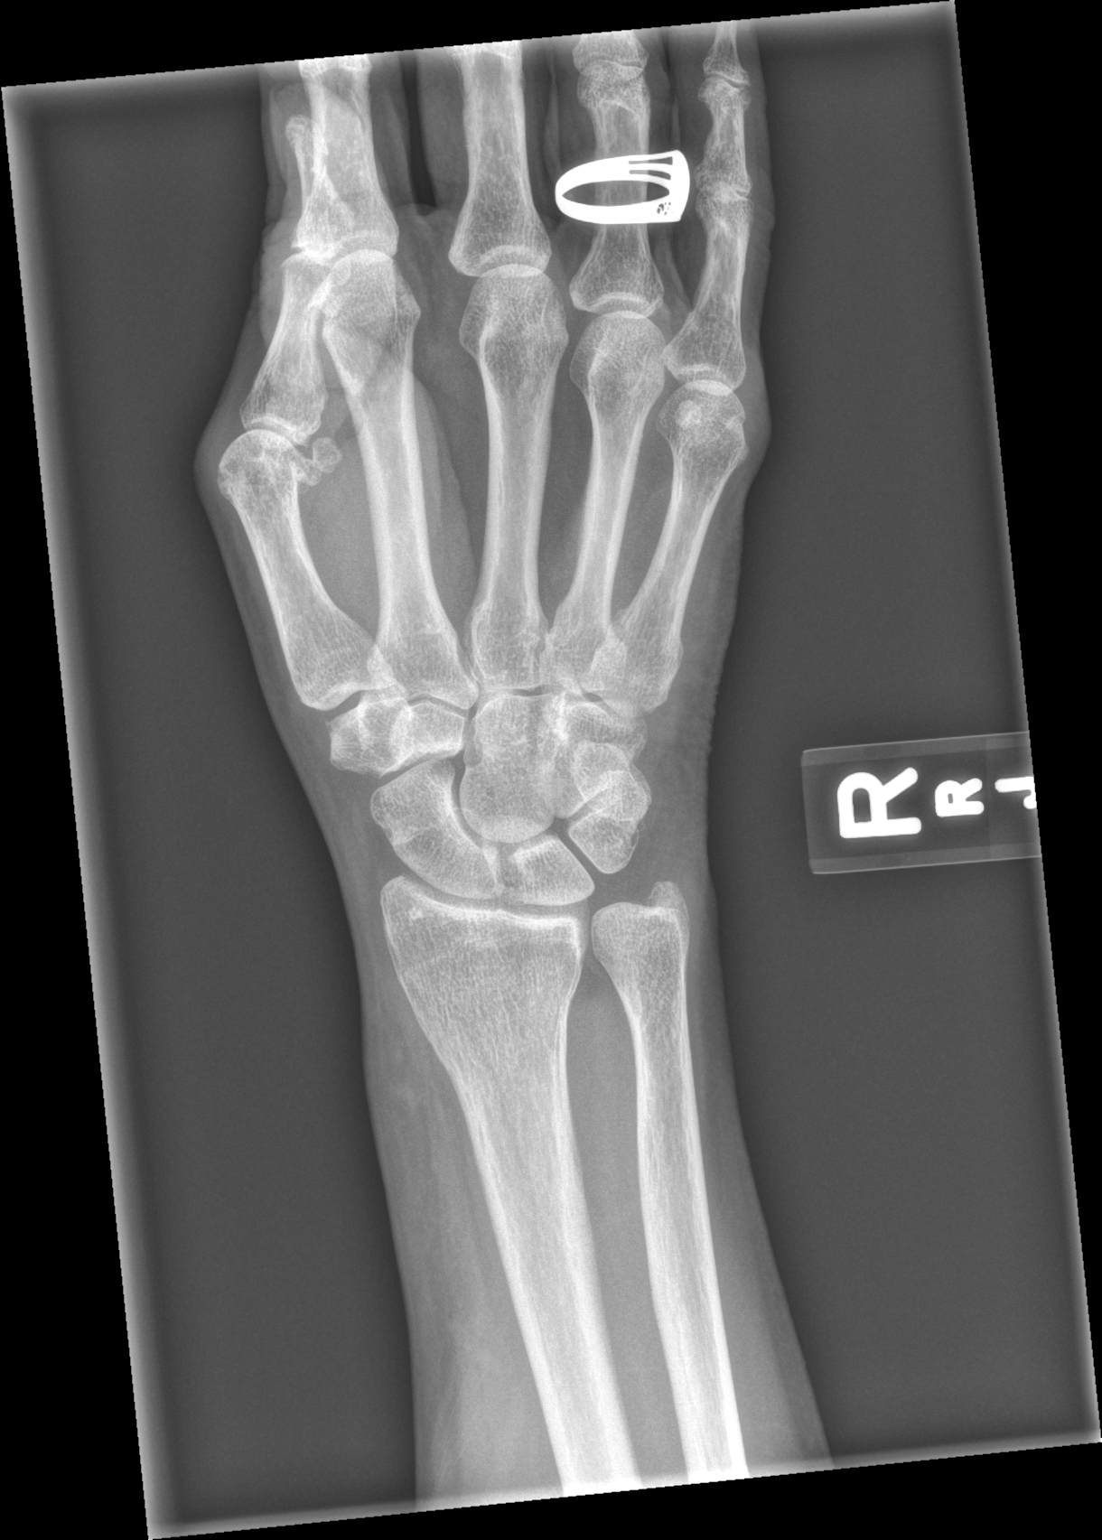

[x wrist lat right]
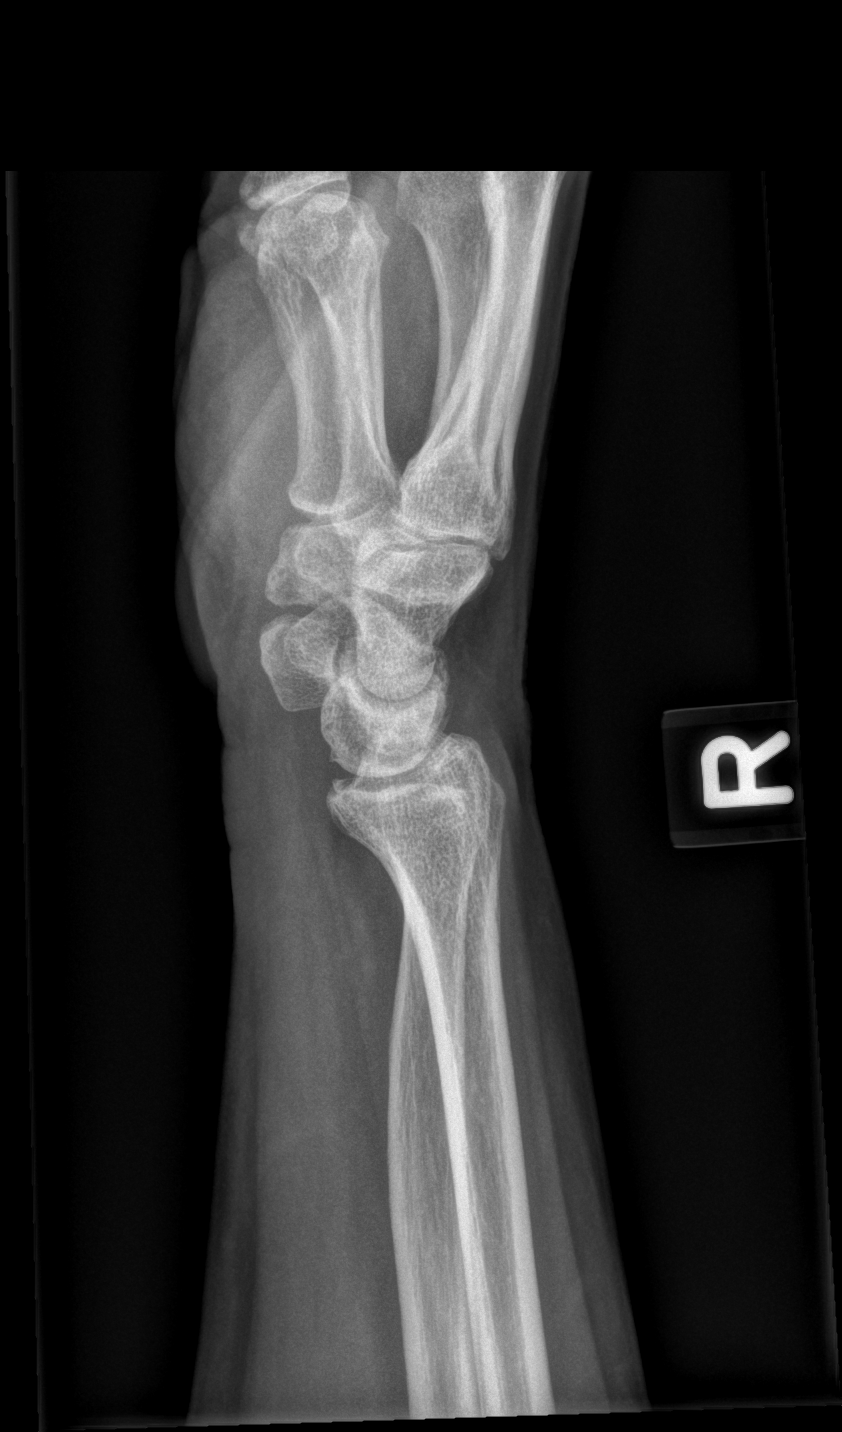

[3 of 3 positions shown; findings below may reference images not displayed]

DIAGNOSTIC STUDIES

EXAM

XR wrist RT min 3V

INDICATION

Cat bite.  Foreign body evaluation
Cat bite. Foreign body evaluation. CF/RG

TECHNIQUE

AP lateral and oblique views

COMPARISONS

None available

FINDINGS

No fractures or dislocations are seen. Joint spaces are well maintained. Small cyst is noted in the
proximal navicular. No radiopaque foreign bodies are seen.

IMPRESSION

A small cystic erosion involving the proximal navicular. No radiopaque foreign bodies are seen. No
fractures are evident.

Tech Notes:

Cat bite. Foreign body evaluation. CF/RG

## 2021-09-25 ENCOUNTER — Encounter: Admit: 2021-09-25 | Discharge: 2021-09-25 | Payer: MEDICARE

## 2021-10-09 IMAGING — CR [ID]
3 series · 3 of 3 positions shown · non-contrast
Comparison: none

[knee ap]
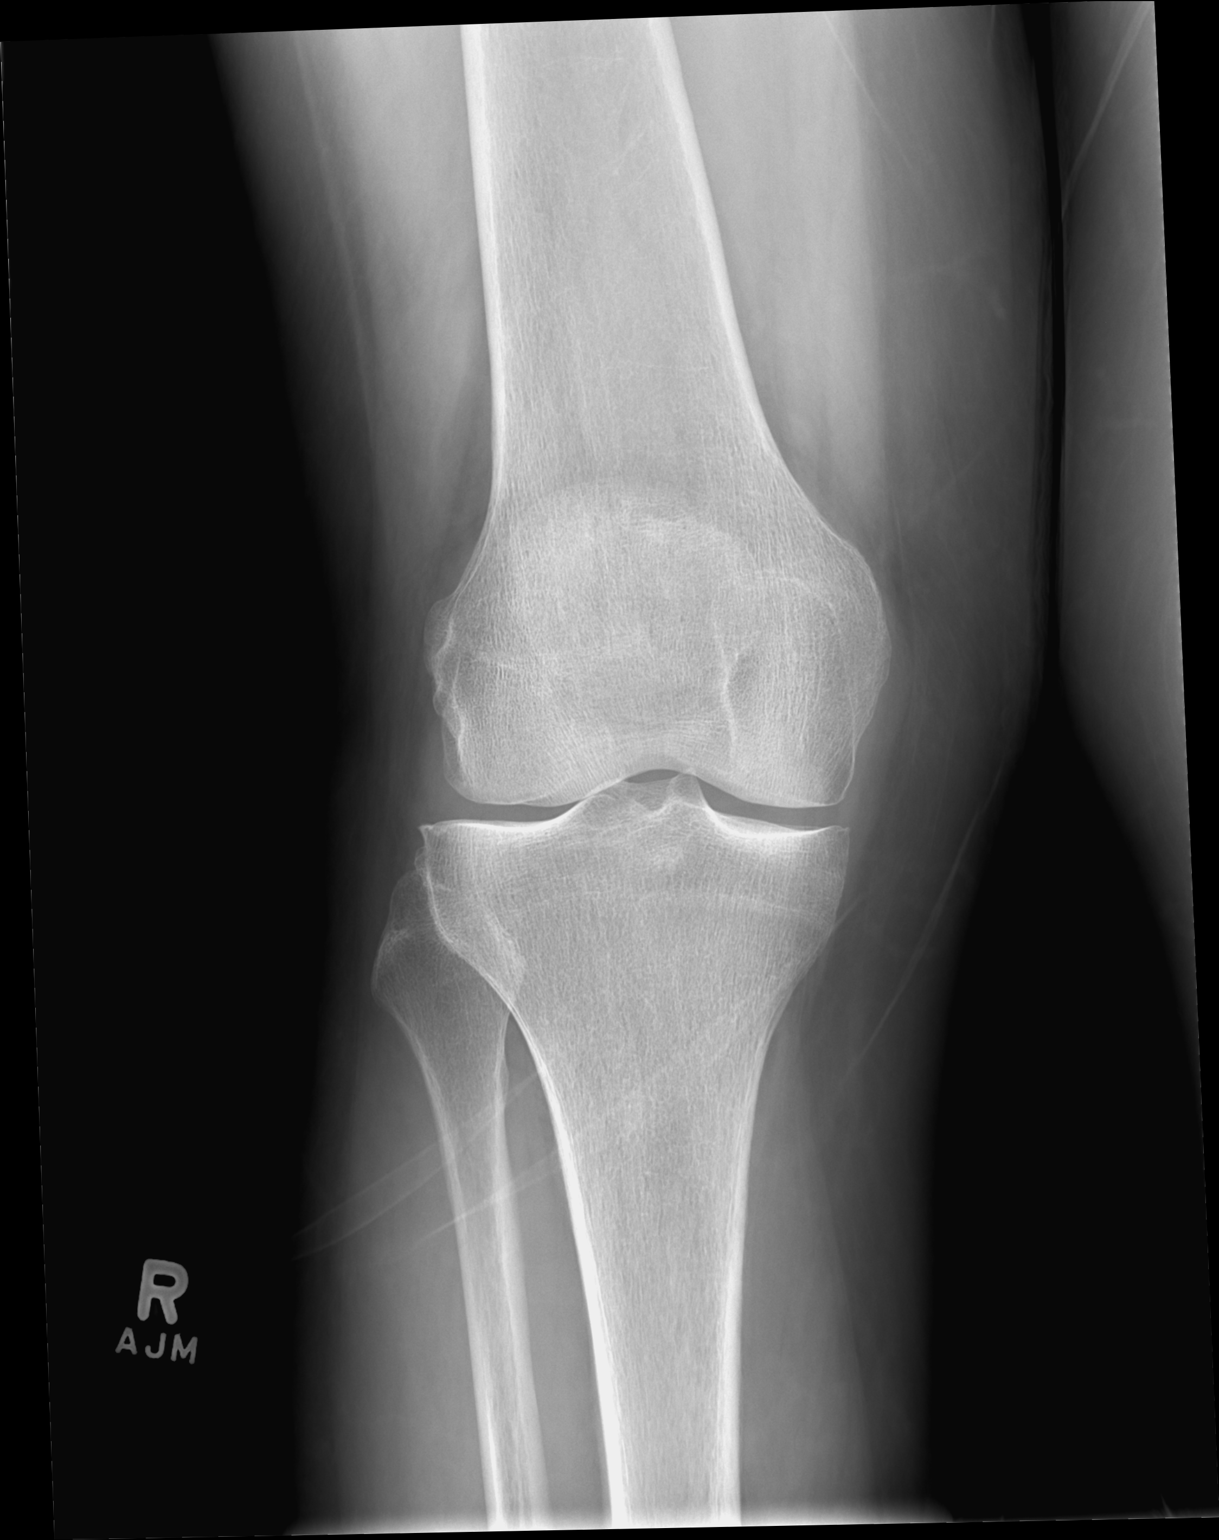

[knee sunrise]
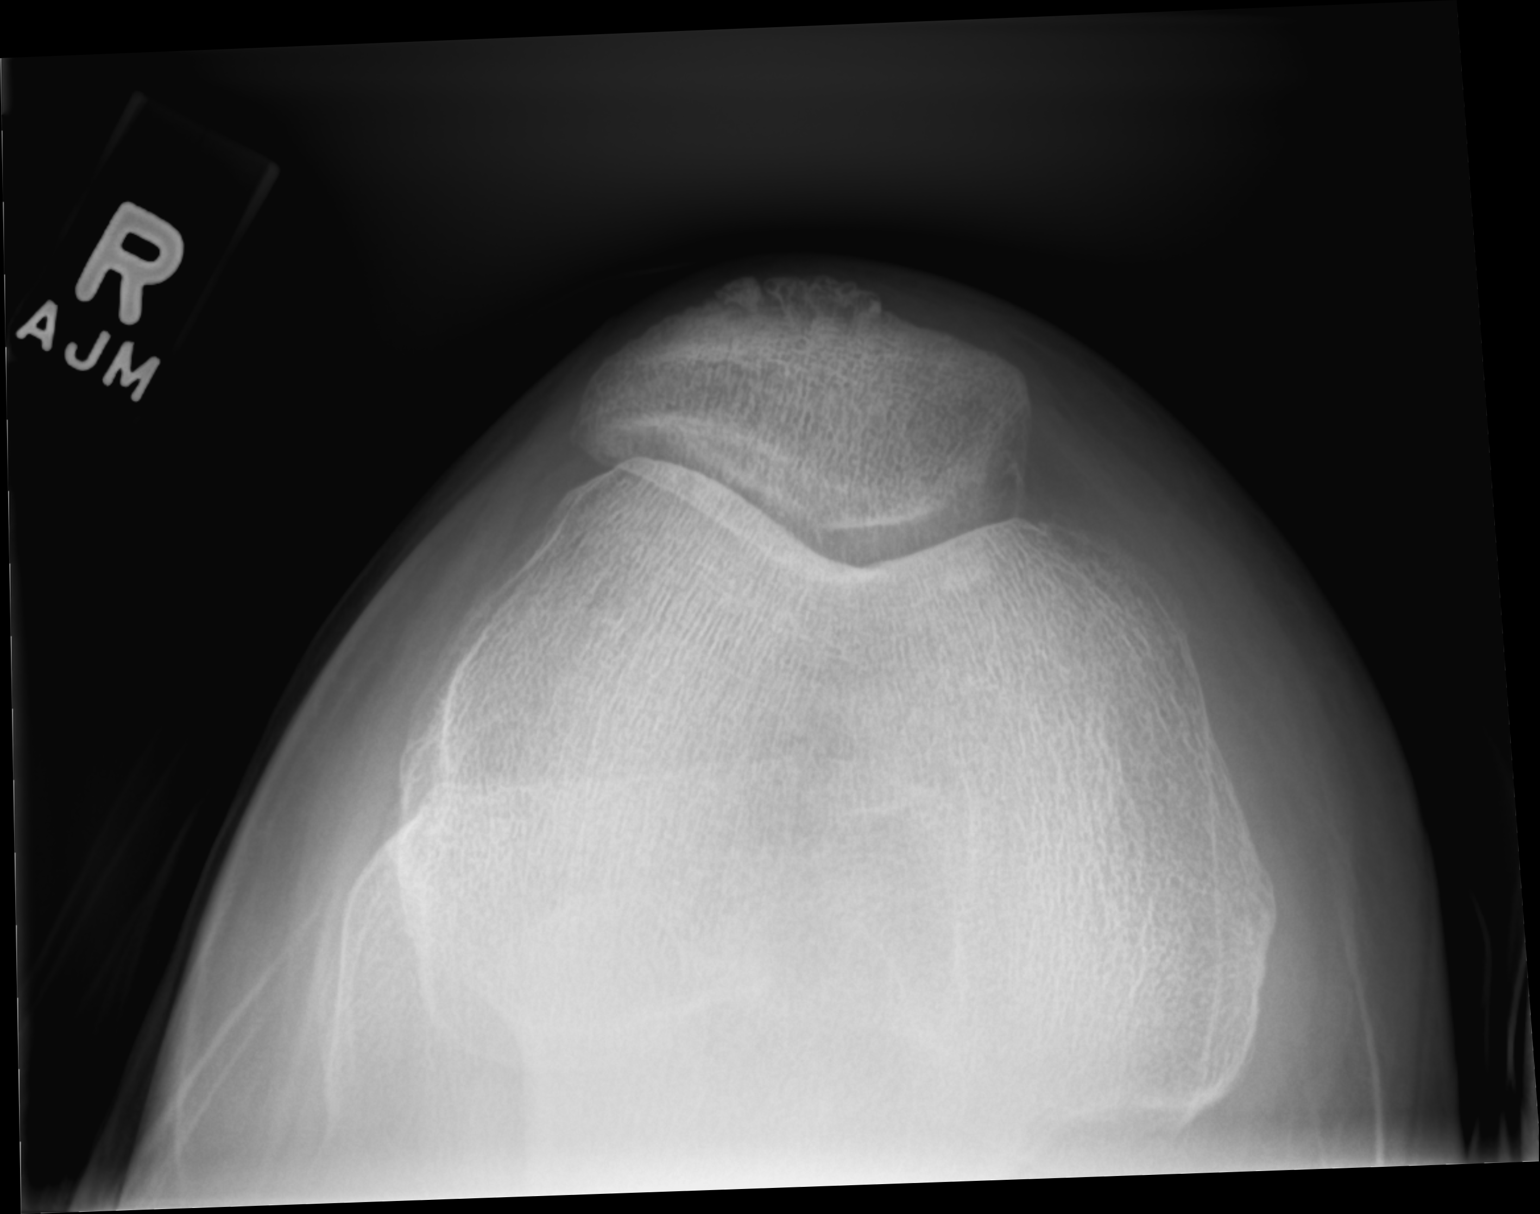

[knee lat]
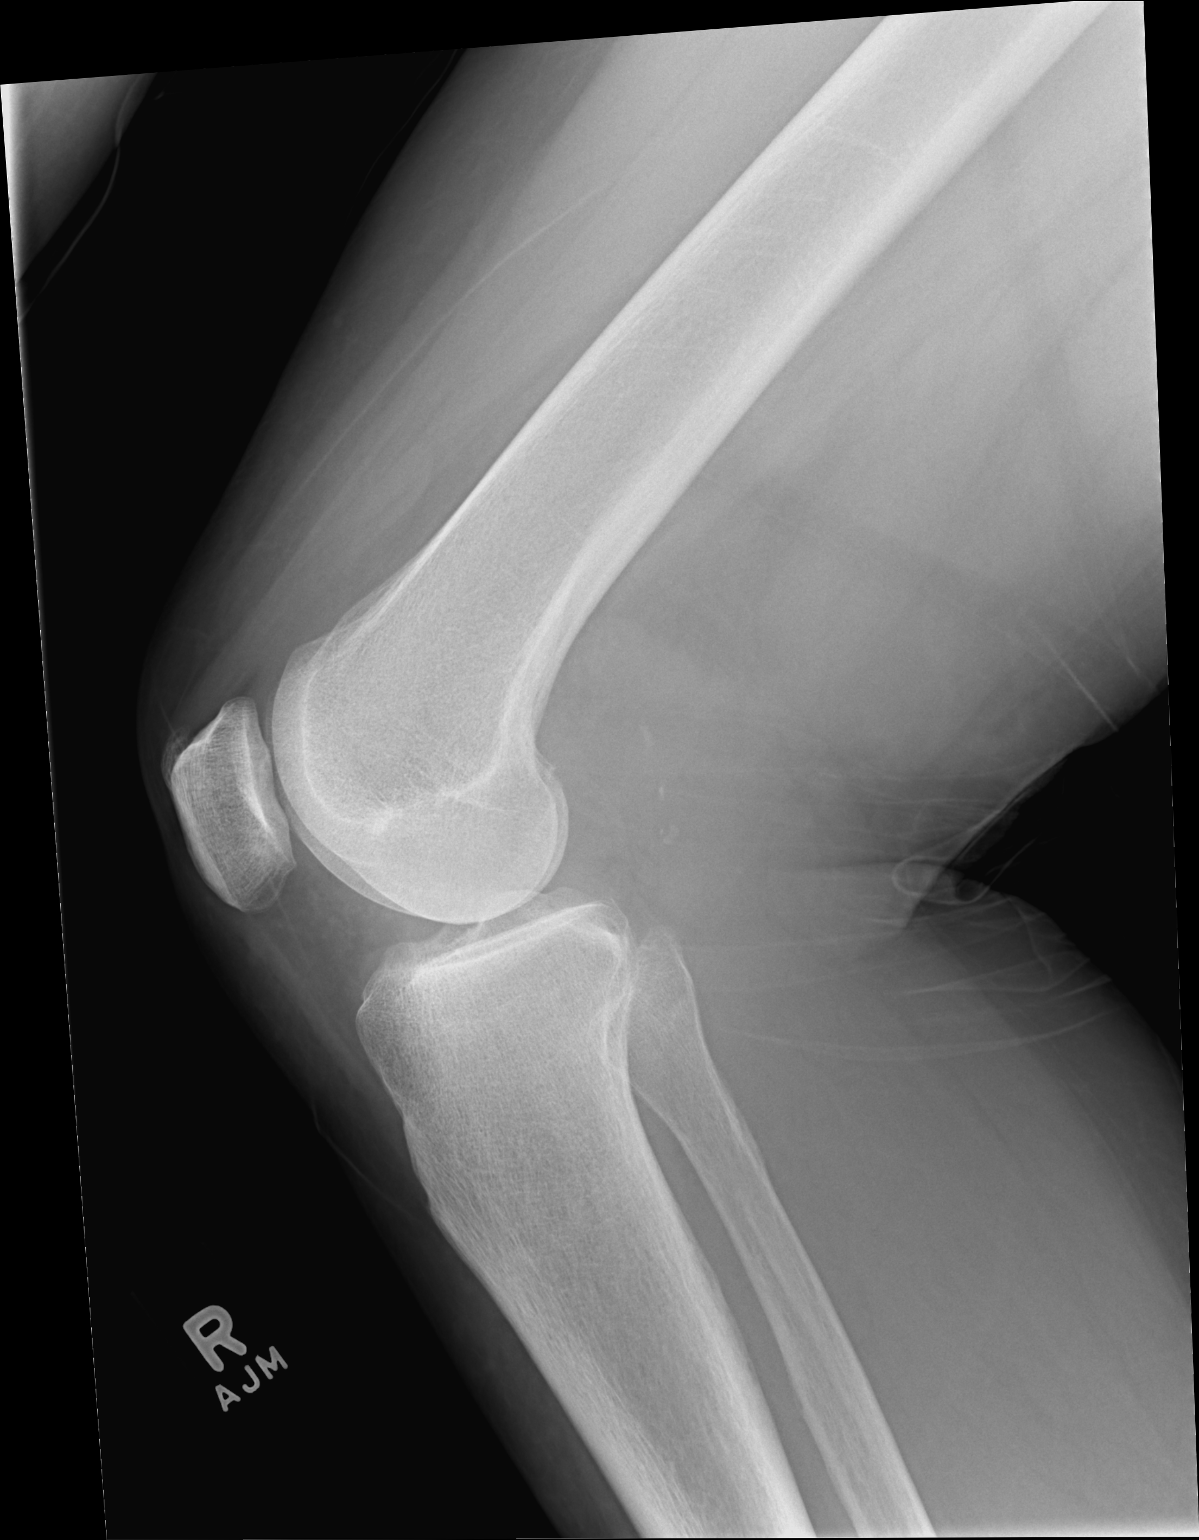

[3 of 3 positions shown; findings below may reference images not displayed]

EXAM

XR knee RT 3V

INDICATION

Knee injury March
PT C/O CONTINUED KNEE PAIN AFTER TWISTING IT IN A FALL 08/18/21.  AB

TECHNIQUE

Three views of the right knee

COMPARISONS

None available at the time of dictation.

FINDINGS

A trace suprapatellar knee joint effusion. No radiographic evidence of an acute fracture, osseous
malalignment, or aggressive focal osseous lesion. No significant weight-bearing compartment joint
space loss. Mild patellofemoral compartment joint space loss in between the lateral trochlear ridge
and lateral patellar facet. Vascular calcifications. Quadriceps insertional enthesophyte.

IMPRESSION
1. No radiographic evidence of an acute osseous abnormality.

Tech Notes:

PT C/O CONTINUED KNEE PAIN AFTER TWISTING IT IN A FALL 08/18/21.  AB

## 2021-10-25 IMAGING — MR MR KNEE^[PERSON_NAME] KNEE
5 series · 35 of 40 positions shown · non-contrast
Comparison: none

[Series 3: t2_tse_fs_ax fs · axial · 4.0mm · 0.31mm/px · z∈[-76,+35]mm · 7 of 25 slices shown]
[im 1/25]
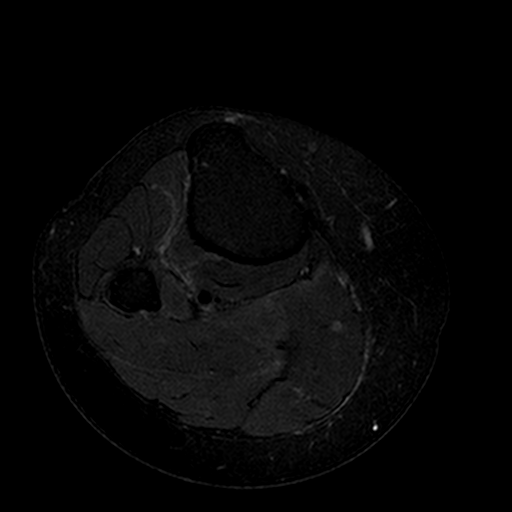
[im 5/25]
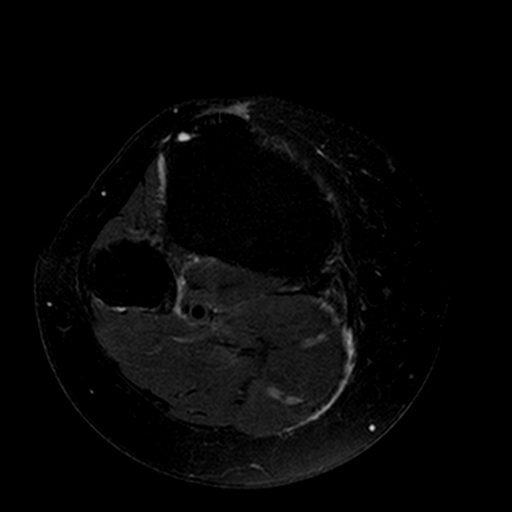
[im 9/25]
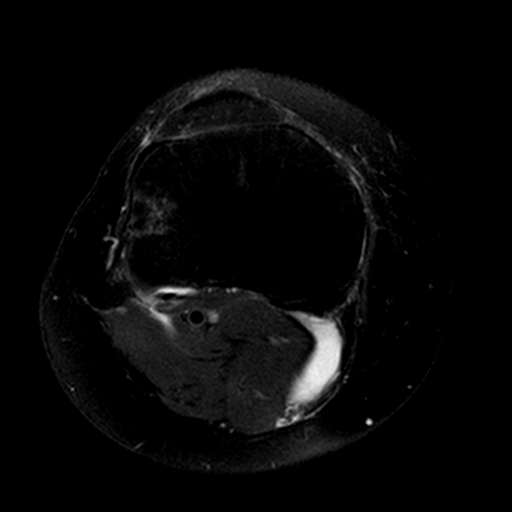
[im 13/25]
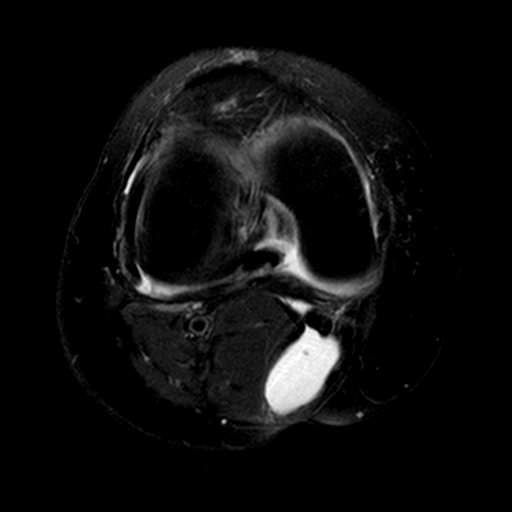
[im 17/25]
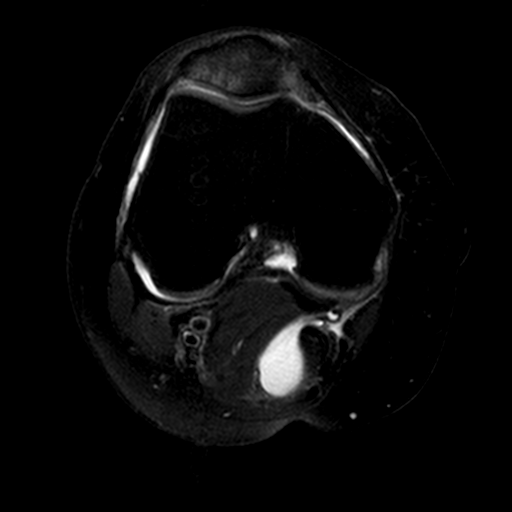
[im 21/25]
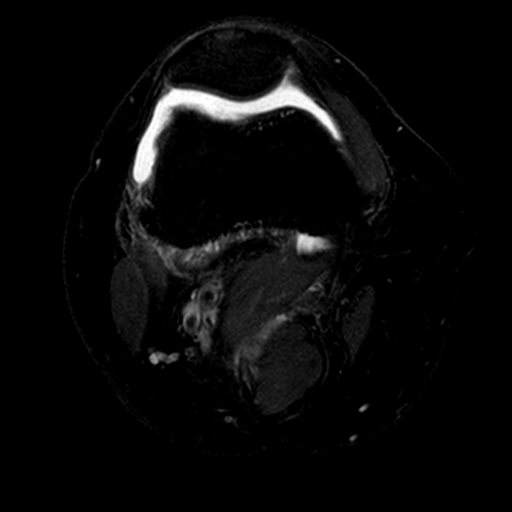
[im 25/25]
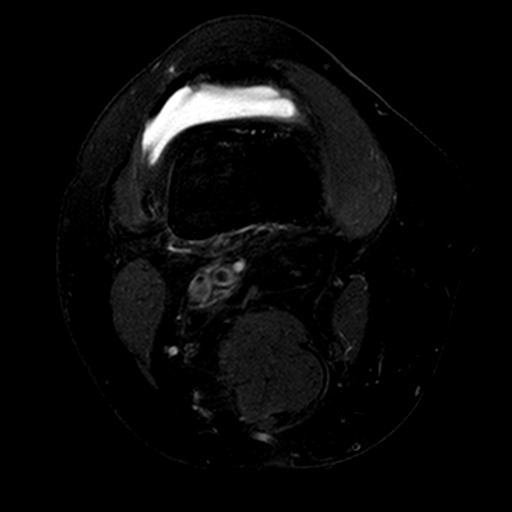

[Series 4: T1 · axial · 4.0mm · 0.62mm/px · z∈[-75,+49]mm · 8 of 28 slices shown]
[im 1/28]
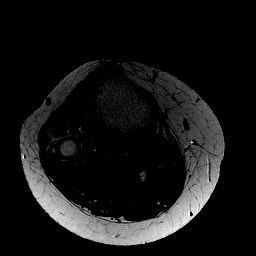
[im 4/28]
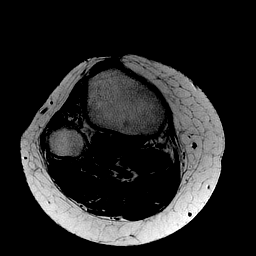
[im 8/28]
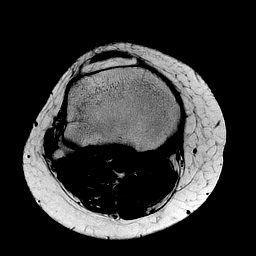
[im 12/28]
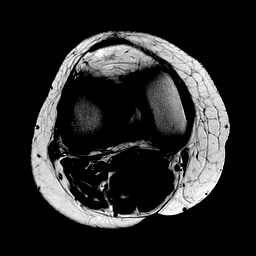
[im 16/28]
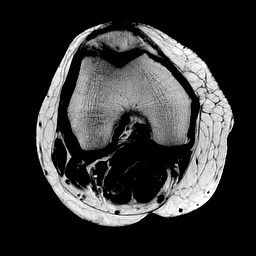
[im 20/28]
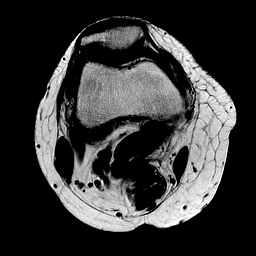
[im 24/28]
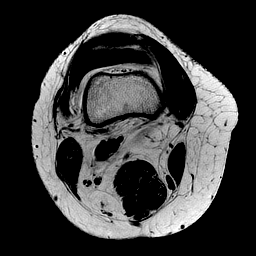
[im 28/28]
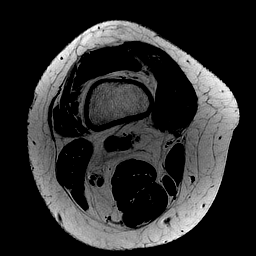

[Series 5: t2_tse_fs_cor fs · coronal · 3.5mm · 0.62mm/px · 9 of 32 slices shown]
[im 1/32]
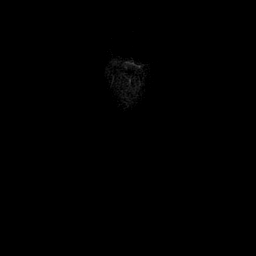
[im 4/32]
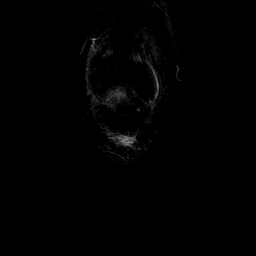
[im 8/32]
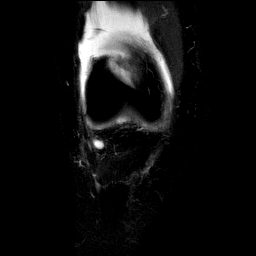
[im 12/32]
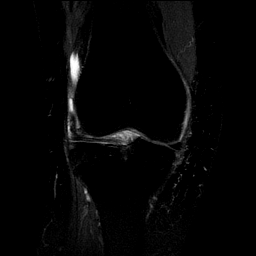
[im 16/32]
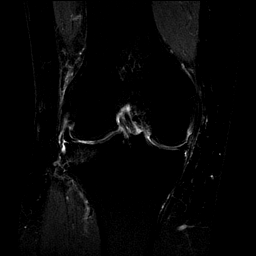
[im 20/32]
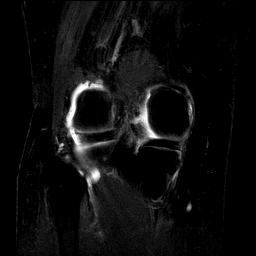
[im 24/32]
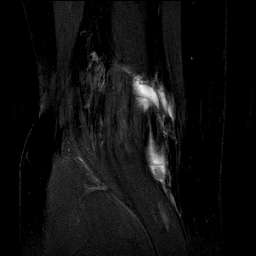
[im 28/32]
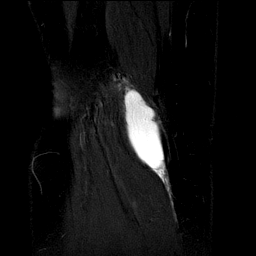
[im 32/32]
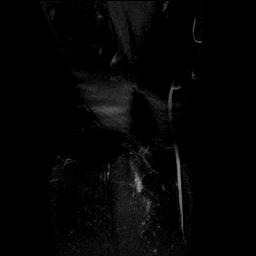

[Series 6: t2_tse_sag fs · sagittal · 3.5mm · 0.59mm/px · 8 of 30 slices shown]
[im 1/30]
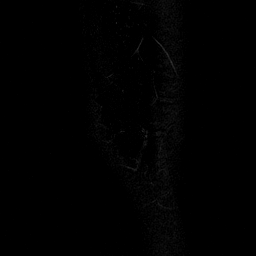
[im 5/30]
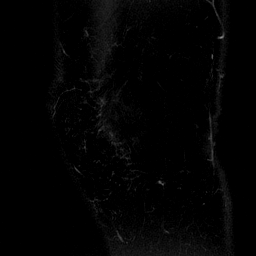
[im 9/30]
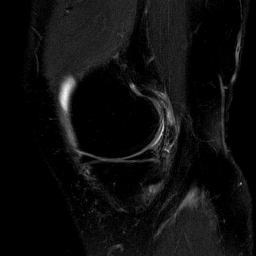
[im 13/30]
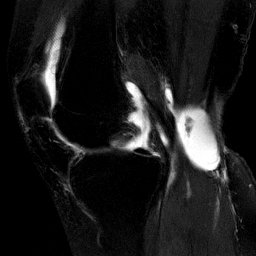
[im 17/30]
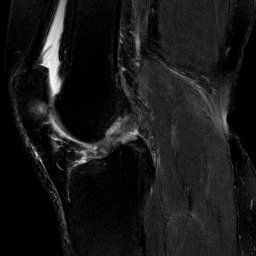
[im 21/30]
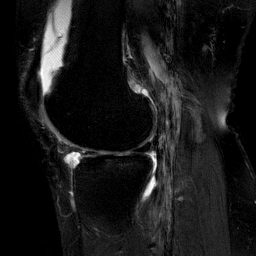
[im 25/30]
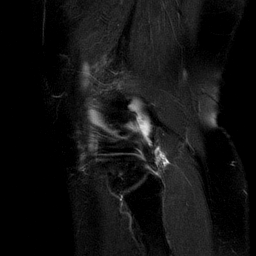
[im 30/30]
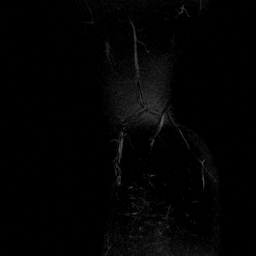

[Series 7: pd_tse_sag fat sat · sagittal · 3.5mm · 0.31mm/px · 3 of 29 slices shown]
[im 1/29]
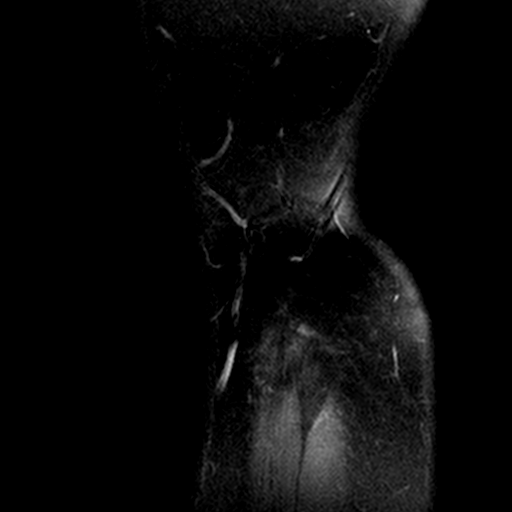
[im 5/29]
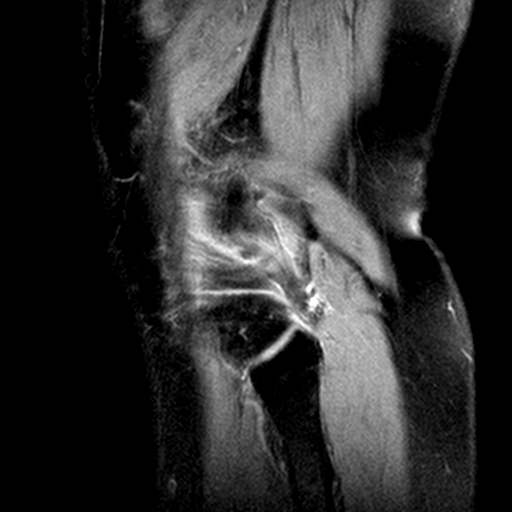
[im 9/29]
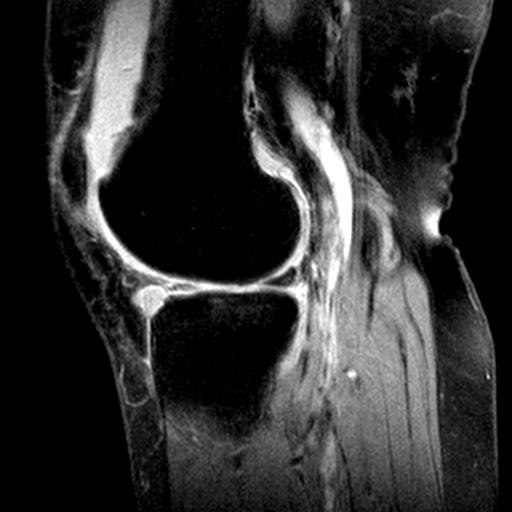

[35 of 40 positions shown; findings below may reference images not displayed]

EXAM

MR knee RT wo con

INDICATION

Suspected meniscal injury
RT LATERAL KNEE PAIN SINCE WRENCHING KNEE, HX STROKE AFFECTING THE LEFT SIDE OF BODY.  RG

TECHNIQUE

MRI of the knee was performed. Sequences include coronal, sagittal, and axial planes with fast spin
echo technique, both in fat saturated and non-fat saturated. No intravenous contrast was
administered.

COMPARISONS

XR right knee 10/09/21

FINDINGS

MEDIAL COMPARTMENT: Intact medial meniscus. Diffuse cartilage thinning.

LATERAL COMPARTMENT: Mild lateral meniscal extrusion. Trace volume loss and underlying longitudinal
vertical type tear of the anterior horn of the lateral meniscus (series 6, image 22). No discrete
measurable meniscal tear. Subchondral focal bone marrow edema of the lateral tibial plateau. Diffuse
cartilage thinning with appearance of full-thickness cartilage loss overlying lateral lip of the the
lateral tibial plateau.

PATELLOFEMORAL COMPARTMENT: Prominent subchondral edema of the lateral patellar facet.

CRUCIATE LIGAMENTS: Intact.

MEDIAL SUPPORTING STRUCTURES: Intact superficial and deep medial collateral ligaments.

LATERAL SUPPORTING STRUCTURES: Intact iliotibial band, lateral capsular ligament, lateral collateral
ligament, biceps femoris tendon, and popliteus tendon.

EXTENSOR MECHANISM: Intact.

JOINT SPACE/FLUID: Small to medium knee joint effusion. Large Baker's cyst with inferior dehiscence.
Baker's cyst measures approximately 5.2 cm craniocaudal. Trace extrusion of fluid posteriorly to the
popliteus tendon sheath (series 3, image 10).

BONES: No acute fracture, osseous contusion, or aggressive focal osseous lesion.

MUSCLES: Normal signal intensity and morphology.

NEUROVASCULAR: Normal popliteal neurovascular bundle.

IMPRESSION
1. Trace volume loss and underlying longitudinal vertical type tear of the anterior horn of the
lateral meniscus. No cruciate ligamentous injury.
2. Broad full-thickness area of cartilage loss at the lateral lip of the lateral tibial plateau with
underlying subchondral edema.
3. Small to medium knee joint effusion.
4. Dehisced large Baker's cyst. Extruded fluid from the popliteus tendon sheath.

Tech Notes:

RT LATERAL KNEE PAIN SINCE WRENCHING KNEE, HX STROKE AFFECTING THE LEFT SIDE OF BODY.  RG

## 2021-12-12 IMAGING — MG MM mammogram 3D screen bilat
9 series · 12 of 25 positions shown · non-contrast
Comparison: none

[R CC]
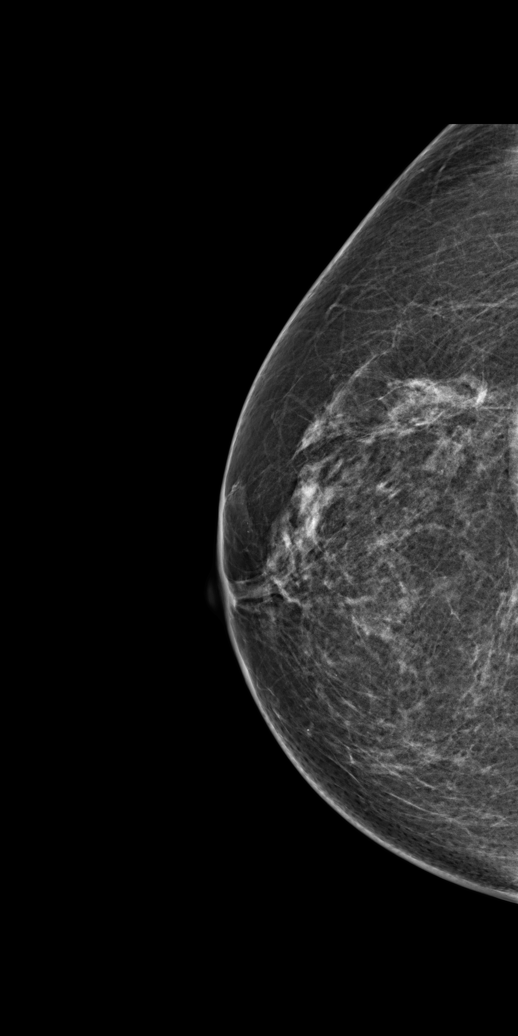

[Series 4: BTO_TOMO R-CC PRIME, EMPIRE_C.97/112, Sc tomo · 4 of 57 frames shown]
[frame 9/57]
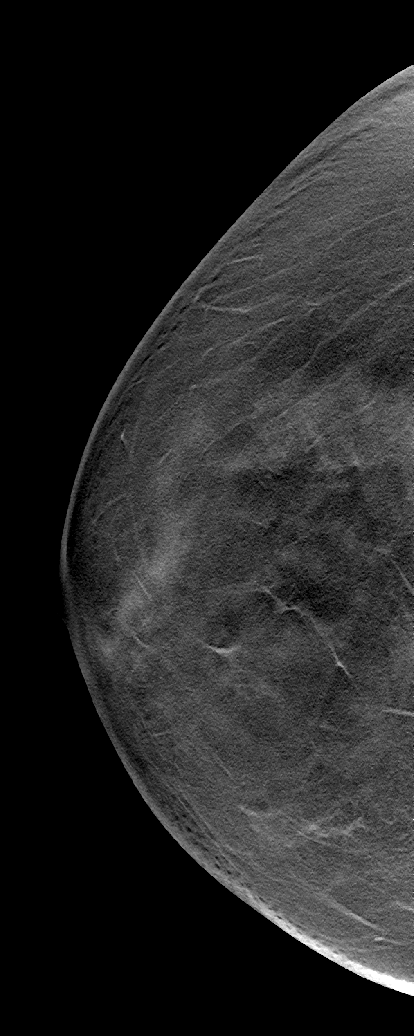
[frame 19/57]
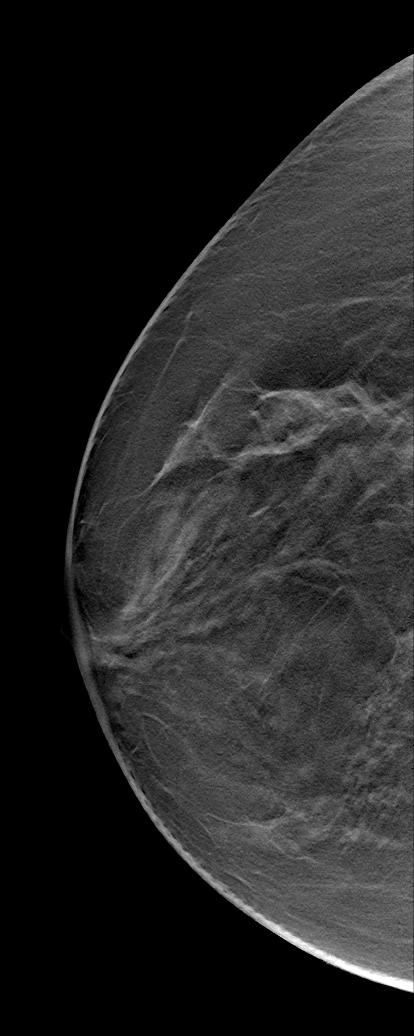
[frame 29/57]
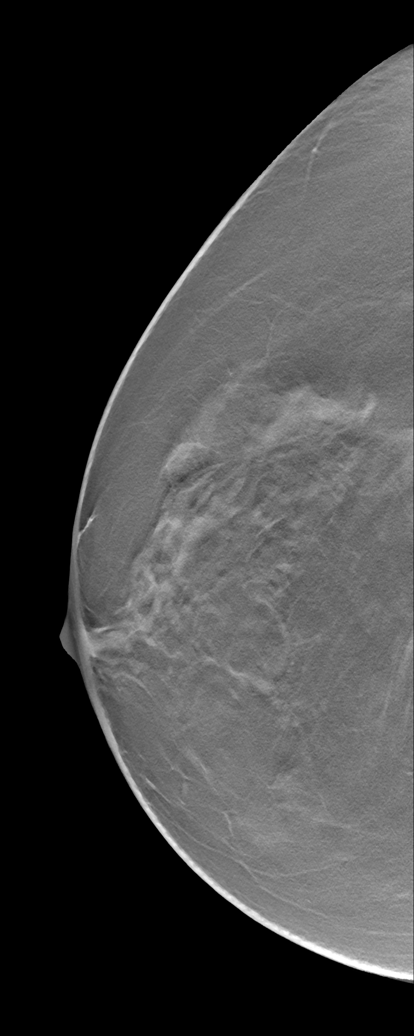
[frame 39/57]
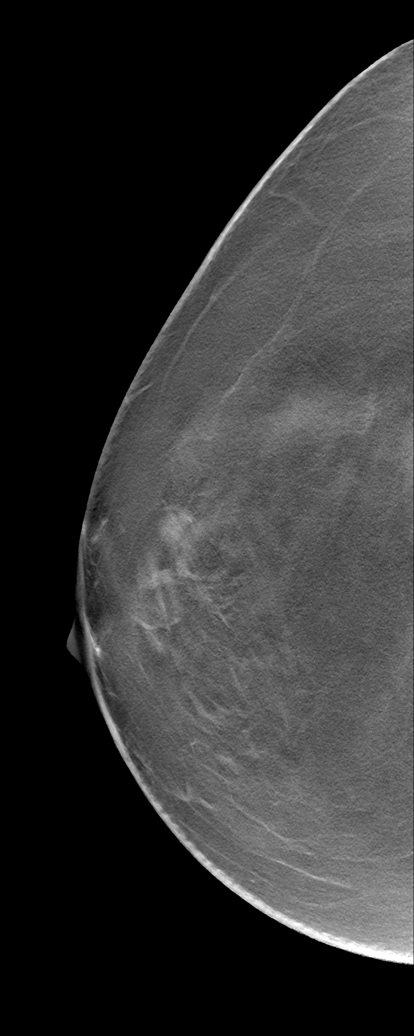

[L CC]
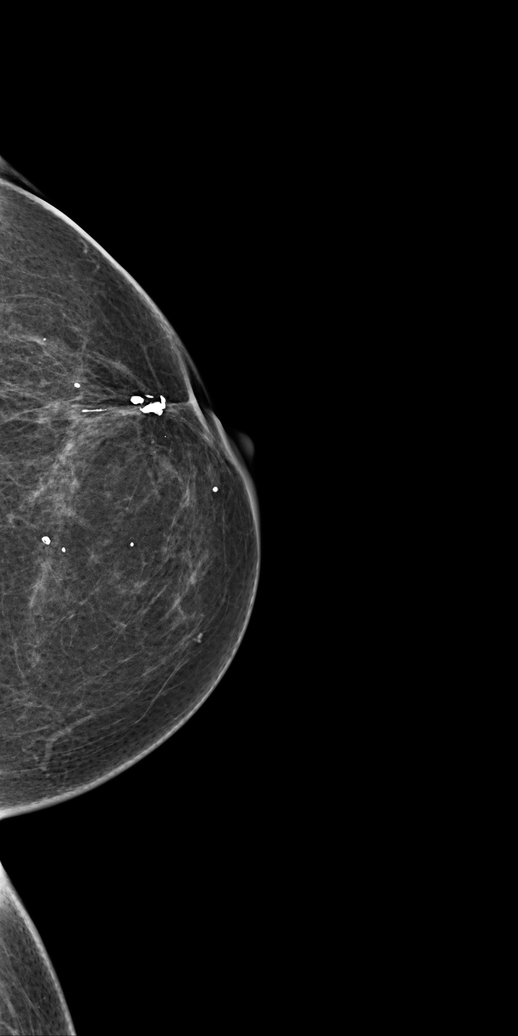

[BTO_TOMO L-CC PRIME, EMPIRE_C.97/112, Sc tomo · tomo slice 27/52.0]
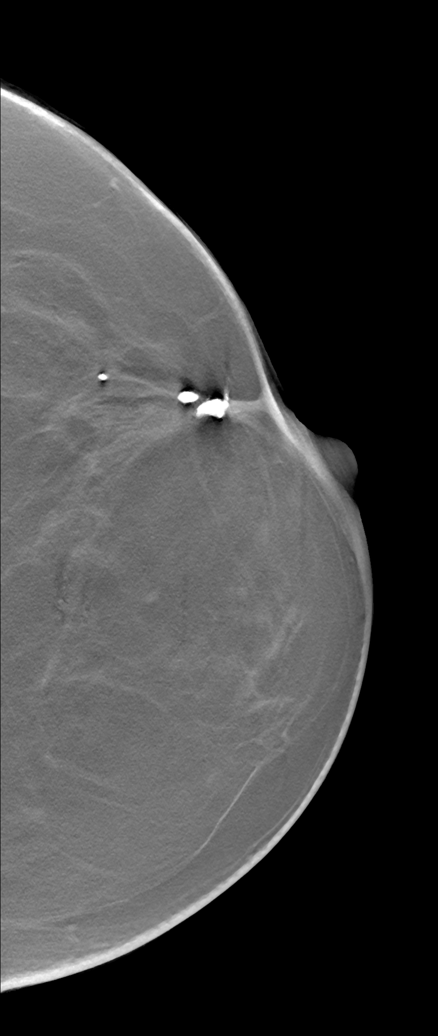

[R MLO]
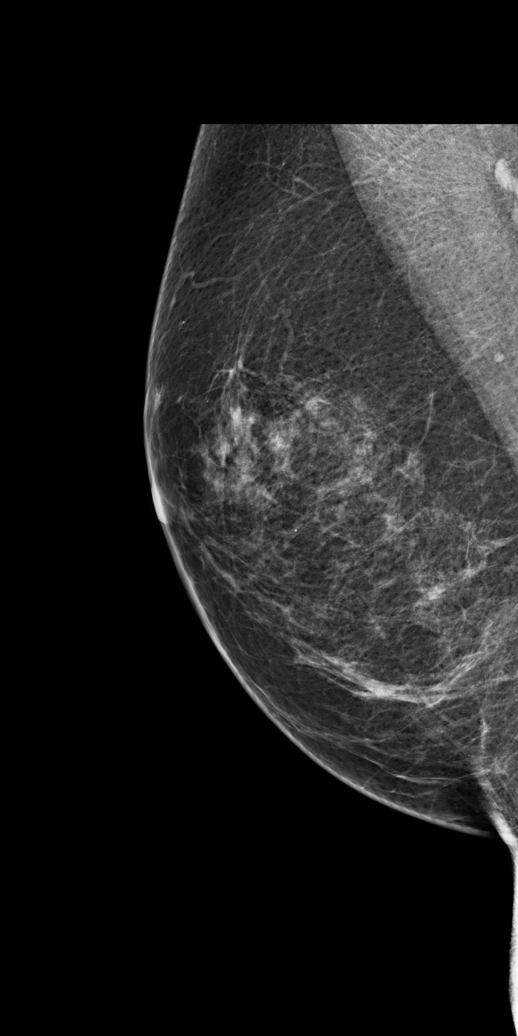

[BTO_TOMO R-MLO PRIME, EMPIRE_C.97/112, S tomo · tomo slice 28/55.0]
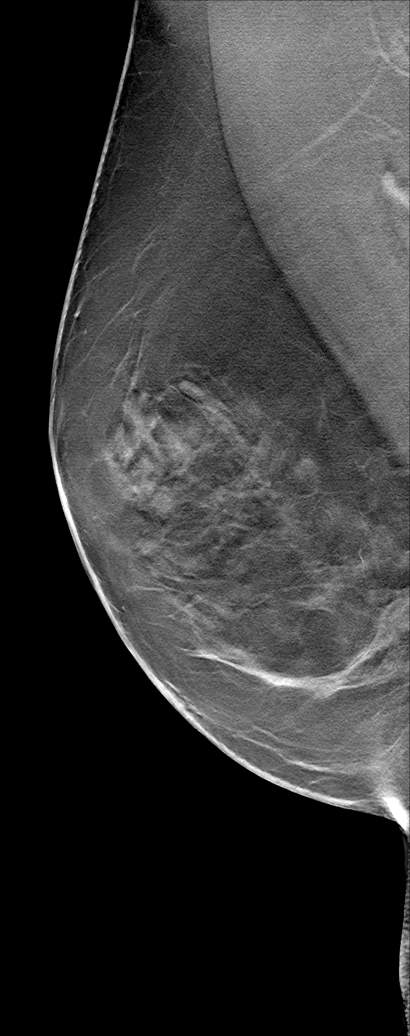

[L MLO (1 of 2)]
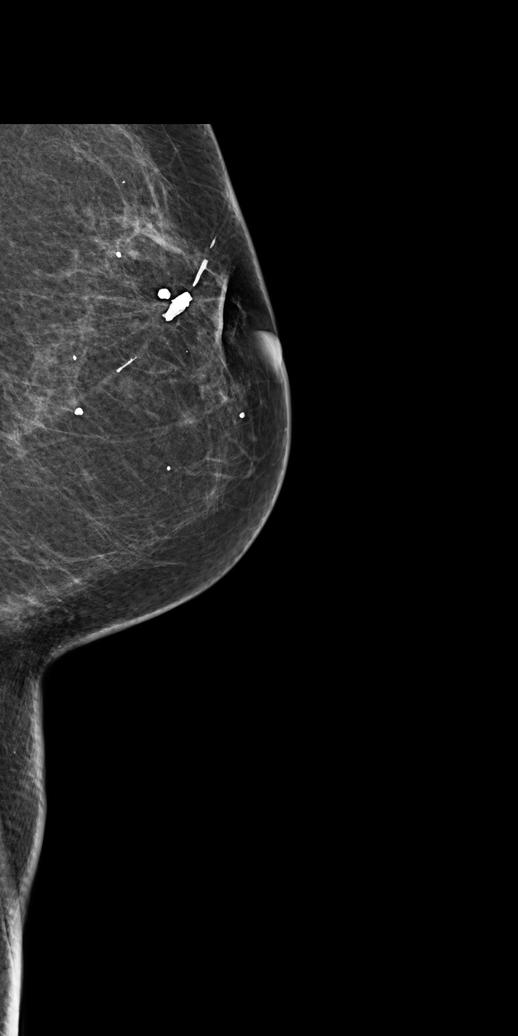

[BTO_TOMO L-MLO PRIME, EMPIRE_C.97/112, S tomo · tomo slice 25/50.0]
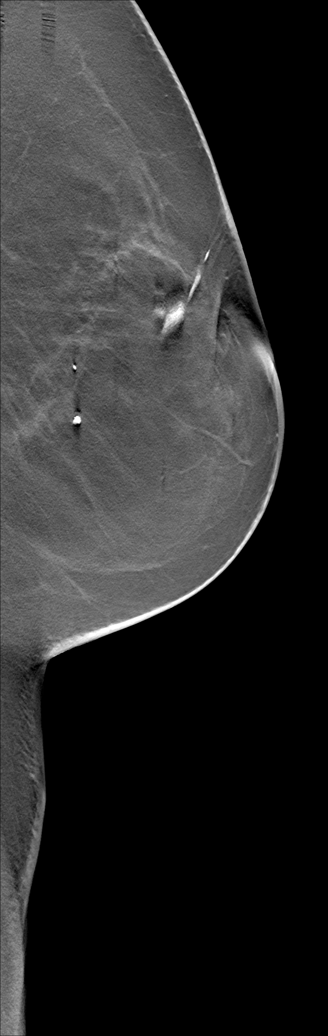

[L MLO (2 of 2)]
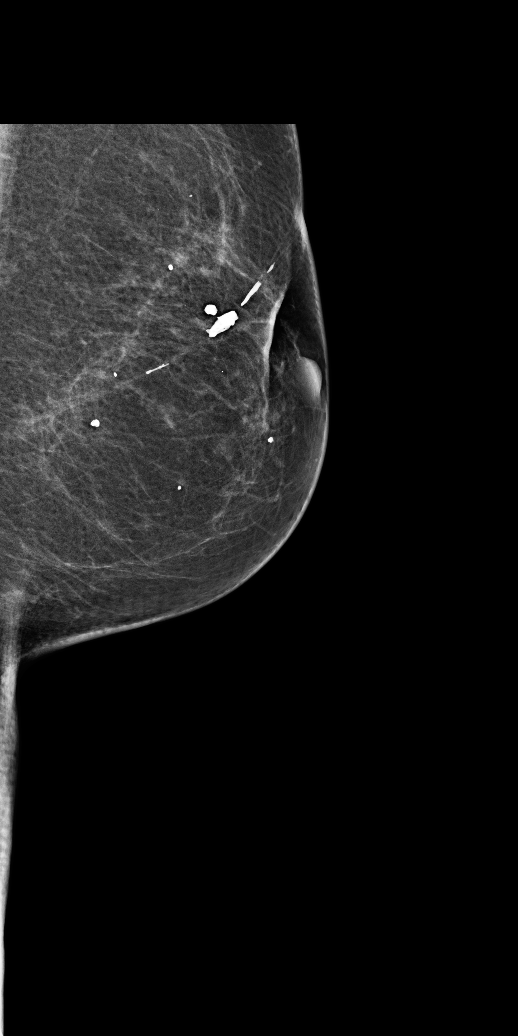

[12 of 25 positions shown; findings below may reference images not displayed]

EXAM

3D SCREENING MAMMOGRAM, BILATERAL

INDICATION

screening
HX. LT BREAST CA 0888.  BREAST LIFT RT BREAST 9494.  FM HX. BREAST CA: 2 SISTERS DX AGES 40   42.
PT HAS HAD STROKE, LIMITED MOBILITY OF LT ARM.  SCREENING.  AB (3D) PRIORS: [id number].

TECHNIQUE

Digital 2D CC and MLO projections obtained with 3D tomographic views per manufacturer's protocol.

COMPARISONS

December 03, 2020

FINDINGS

ACR Type 2:  25-50% There are scattered fibroglandular densities.

Post therapeutic changes left breast are re-demonstrated. No concerning masses or calcifications are
seen.

IMPRESSION

Stable bilateral mammography. Year follow-up is recommended.

BI-RADS 2, BENIGN.

Tech Notes:

## 2022-02-06 IMAGING — CR [ID]
3 series · 3 of 3 positions shown · non-contrast
Comparison: None available at the time of dictation

=================================================

XR knee LT 3V
INDICATION: bilat knee pain
lt knee pain, denies trauma and no surgical hx to  knee
TECHNIQUE: 3 views of the left knee

[w knee ap left]
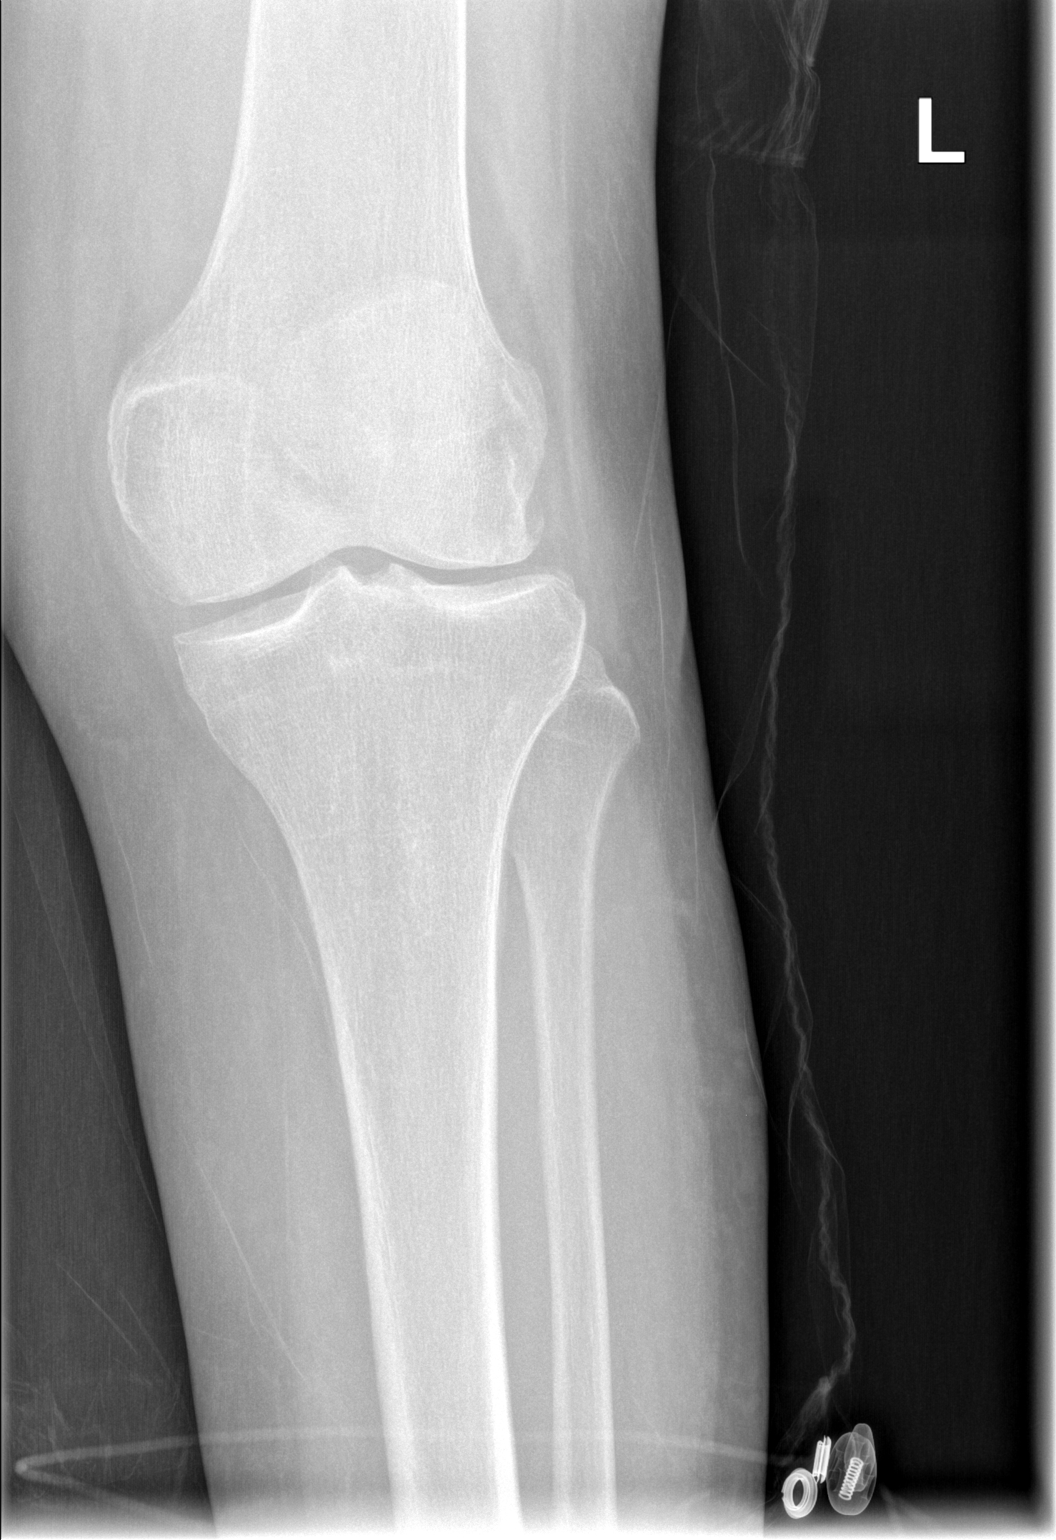

[w knee lat left]
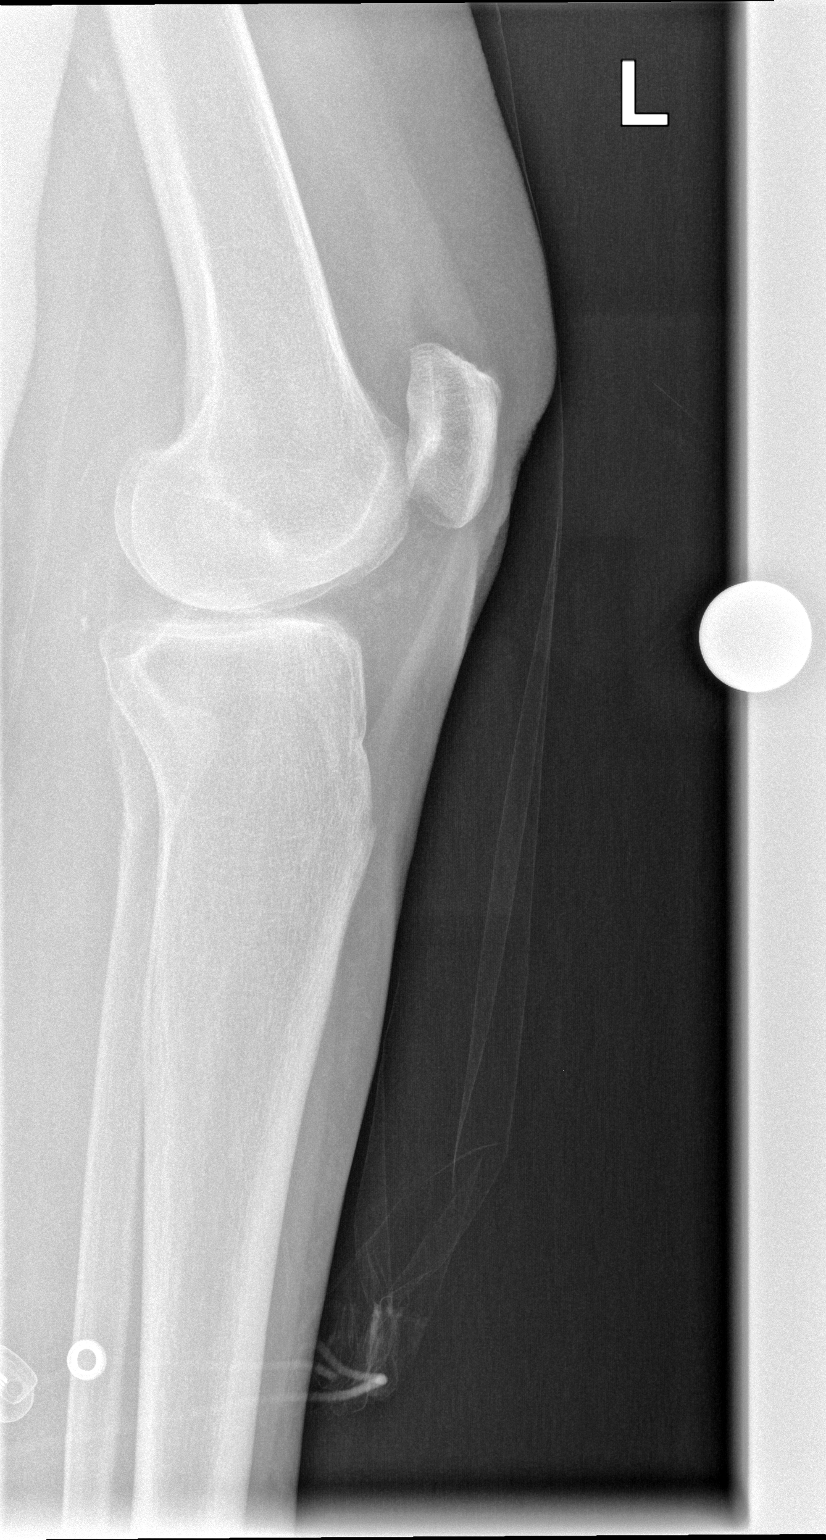

[x knee sunrise left]
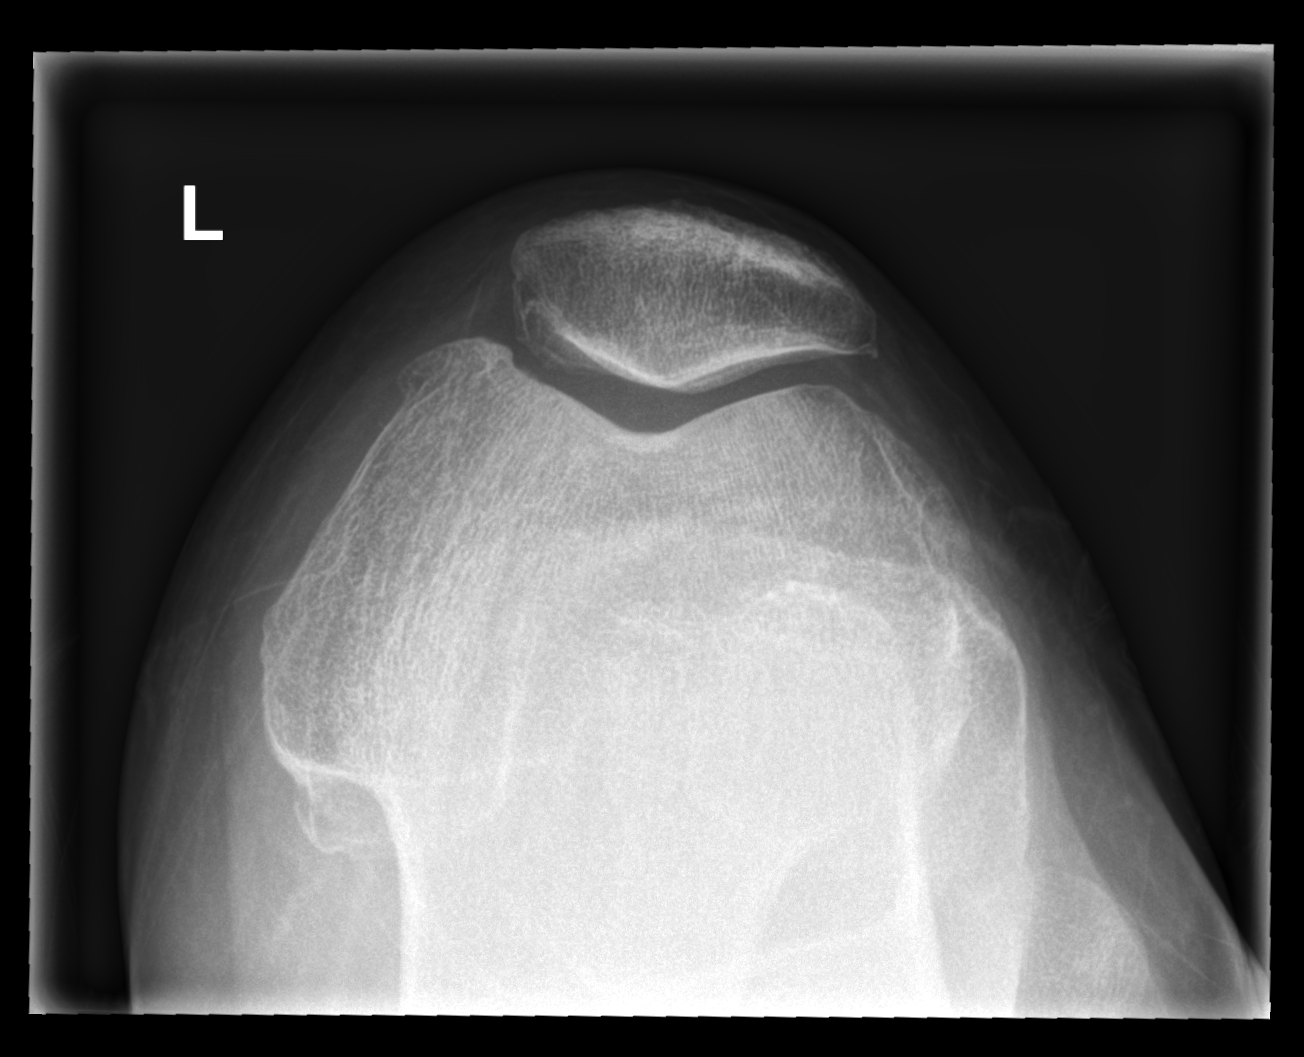

[3 of 3 positions shown; findings below may reference images not displayed]

FINDINGS: =================================================

No fracture, osseous malalignment, or aggressive focal osseous lesion. Anatomic joint alignment. No
joint effusion. Minimal osseous spurring along the medial trochlear ridge. Superficial varicosities
of the left lateral aspect of the left calf.

=================================================
IMPRESSION: =================================================
1.  No radiographic evidence of an acute osseous abnormality or significant degenerative change.

Tech Notes:

lt knee pain, denies trauma and no surgical hx to  knee
